# Patient Record
Sex: Male | Born: 1970 | Race: White | Hispanic: No | Marital: Married | Smoking: Former smoker
Health system: Southern US, Community
[De-identification: ages and names within clinical notes are randomized; demographics above are authoritative.]

## PROBLEM LIST (undated history)

## (undated) DIAGNOSIS — C760 Malignant neoplasm of head, face and neck: Secondary | ICD-10-CM

## (undated) DIAGNOSIS — E119 Type 2 diabetes mellitus without complications: Secondary | ICD-10-CM

## (undated) DIAGNOSIS — T7840XA Allergy, unspecified, initial encounter: Secondary | ICD-10-CM

## (undated) HISTORY — DX: Type 2 diabetes mellitus without complications: E11.9

## (undated) HISTORY — DX: Allergy, unspecified, initial encounter: T78.40XA

---

## 2002-03-03 HISTORY — PX: CHOLECYSTECTOMY: SHX55

## 2013-10-21 ENCOUNTER — Ambulatory Visit: Payer: Self-pay | Admitting: Family Medicine

## 2015-01-23 DIAGNOSIS — R0683 Snoring: Secondary | ICD-10-CM | POA: Insufficient documentation

## 2015-01-23 DIAGNOSIS — F200 Paranoid schizophrenia: Secondary | ICD-10-CM | POA: Insufficient documentation

## 2015-01-23 DIAGNOSIS — R0781 Pleurodynia: Secondary | ICD-10-CM | POA: Insufficient documentation

## 2015-01-23 DIAGNOSIS — R0789 Other chest pain: Secondary | ICD-10-CM | POA: Insufficient documentation

## 2015-09-06 DIAGNOSIS — Z6839 Body mass index (BMI) 39.0-39.9, adult: Secondary | ICD-10-CM | POA: Insufficient documentation

## 2016-01-01 DIAGNOSIS — IMO0002 Reserved for concepts with insufficient information to code with codable children: Secondary | ICD-10-CM | POA: Insufficient documentation

## 2018-05-10 ENCOUNTER — Ambulatory Visit: Admission: EM | Admit: 2018-05-10 | Discharge: 2018-05-10 | Discharge status: Absent without leave | Payer: Self-pay

## 2018-07-20 ENCOUNTER — Other Ambulatory Visit: Payer: Self-pay | Admitting: Otolaryngology

## 2018-07-20 DIAGNOSIS — K1370 Unspecified lesions of oral mucosa: Secondary | ICD-10-CM

## 2018-07-23 ENCOUNTER — Ambulatory Visit
Admission: RE | Admit: 2018-07-23 | Discharge: 2018-07-23 | Disposition: A | Discharge status: Home / Self Care | Payer: Medicare Other | Source: Ambulatory Visit | Attending: Otolaryngology | Admitting: Otolaryngology

## 2018-07-23 ENCOUNTER — Encounter (INDEPENDENT_AMBULATORY_CARE_PROVIDER_SITE_OTHER): Payer: Self-pay

## 2018-07-23 ENCOUNTER — Other Ambulatory Visit: Payer: Self-pay

## 2018-07-23 DIAGNOSIS — K1370 Unspecified lesions of oral mucosa: Secondary | ICD-10-CM | POA: Diagnosis not present

## 2018-07-30 ENCOUNTER — Other Ambulatory Visit: Payer: Self-pay

## 2018-07-30 DIAGNOSIS — C4442 Squamous cell carcinoma of skin of scalp and neck: Secondary | ICD-10-CM | POA: Insufficient documentation

## 2018-07-30 DIAGNOSIS — C760 Malignant neoplasm of head, face and neck: Secondary | ICD-10-CM | POA: Insufficient documentation

## 2018-07-30 NOTE — Progress Notes (Signed)
[place]  Telephone:(336) [phone] Fax:(336) [phone]  ID: Lucas Escobar OB: 1970/10/13  MR#: [id number]  HGD#:[id number]  Patient Care Team: Marygrace Drought, MD as PCP - General (Family Medicine)  I connected with Lucas Escobar on 08/02/18 at  2:15 PM EDT by video enabled telemedicine visit and verified that I am speaking with the correct person using two identifiers.   I discussed the limitations, risks, security and privacy concerns of performing an evaluation and management service by telemedicine and the availability of in-person appointments. I also discussed with the patient that there may be a patient responsible charge related to this service. The patient expressed understanding and agreed to proceed.   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  He initially presented with pain.  Patient declined in person visit secondary to his paranoid schizophrenia and requested a video enabled telemedicine visit so that his wife could be involved.  Other than his pain he feels well.  He has no neurologic complaints.  He denies any recent fevers or illnesses.  He has a good appetite and denies weight loss.  He denies any chest pain, shortness of breath, cough, or hemoptysis.  He has no nausea, vomiting, constipation, or diarrhea.  He has no urinary complaints.  Patient offers no further specific complaints today.  REVIEW OF SYSTEMS:   Review of Systems  Constitutional: Negative.  Negative for fever, malaise/fatigue and weight loss.  HENT: Negative for sinus pain and sore throat.   Respiratory: Negative.  Negative for cough, hemoptysis and shortness of breath.     Cardiovascular: Negative.  Negative for chest pain and leg swelling.  Gastrointestinal: Negative.  Negative for abdominal pain.  Genitourinary: Negative.  Negative for dysuria.  Musculoskeletal: Negative.  Negative for back pain.  Skin: Negative.  Negative for rash.  Neurological: Negative.  Negative for dizziness, speech change, focal weakness, weakness and headaches.  Psychiatric/Behavioral: Negative.  The patient is not nervous/anxious.     As per HPI. Otherwise, a complete review of systems is negative.  PAST MEDICAL HISTORY: Paranoid schizophrenia. No past medical history on file.  PAST SURGICAL HISTORY: Cholecystectomy.  FAMILY HISTORY: No family history on file.  ADVANCED DIRECTIVES (Y/N):  N  HEALTH MAINTENANCE: Social History   Tobacco Use   Smoking status: Former Smoker    Years: 25.00   Smokeless tobacco: Current User    Types: Snuff, Chew  Substance Use Topics   Alcohol use: Not on file   Drug use: Not on file     Colonoscopy:  PAP:  Bone density:  Lipid panel:  Allergies  Allergen Reactions   Iodine    Latex    Shellfish Allergy     Current Outpatient Medications  Medication Sig Dispense Refill   ARIPiprazole (ABILIFY) 20 MG tablet Take by mouth.     HYDROcodone-acetaminophen (NORCO/VICODIN) 5-325 MG tablet Take 1 tablet by mouth every 6 (six) hours as needed for moderate pain. 90 tablet 0   metFORMIN (GLUCOPHAGE) 500 MG tablet Take by mouth.     traZODone (DESYREL) 100 MG tablet Please take 1 tablet (100mg ) by mouth nightly for sleep.  You may take another 1/2 tablet (50mg ) by mouth nightly as needed for sleep.  No current facility-administered medications for this visit.     OBJECTIVE: There were no vitals filed for this visit.   There is no height or weight on file to calculate BMI.    ECOG FS:0 - Asymptomatic  General: Well-developed, well-nourished, no acute distress. HEENT: Normocephalic, no obvious enlarged lymph nodes  visualized. Neuro: Alert, answering all questions appropriately. Cranial nerves grossly intact. Skin: No rashes or petechiae noted. Psych: Normal affect.  LAB RESULTS:  No results found for: NA, K, CL, CO2, GLUCOSE, BUN, CREATININE, CALCIUM, PROT, ALBUMIN, AST, ALT, ALKPHOS, BILITOT, GFRNONAA, GFRAA  No results found for: WBC, NEUTROABS, HGB, HCT, MCV, PLT   STUDIES: Ct Soft Tissue Neck Wo Contrast  Result Date: 07/23/2018 CLINICAL DATA:  Oral mucosal lesion and enlarged left-sided lymph node. EXAM: CT NECK WITHOUT CONTRAST TECHNIQUE: Multidetector CT imaging of the neck was performed following the standard protocol without intravenous contrast. COMPARISON:  None. FINDINGS: Pharynx and larynx: Assessment for mucosal lesions is limited by the absence of IV contrast, however there is asymmetric masslike left buccal space soft tissue measuring approximately 2 x 2 cm (series 3, image 31). Patent airway. Unremarkable larynx. Salivary glands: No inflammation, mass, or stone. Thyroid: Unremarkable. Lymph nodes: 3.0 x 2.2 cm left level IB lymph node with internal low density anteriorly. Mildly enlarged lymph node slightly more anteriorly in left level IB measuring 12 mm in short axis with a prominent fatty hilum, not clearly pathologic. Mildly prominent number of subcentimeter short axis lymph nodes in the neck bilaterally which are largely symmetric between left and right sides. Vascular: Limited assessment in the absence of IV contrast. Limited intracranial: Unremarkable. Visualized orbits: Unremarkable. Mastoids and visualized paranasal sinuses: Mild mucosal thickening in the bilateral ethmoid, left sphenoid, and bilateral maxillary sinuses. Clear mastoid air cells. Skeleton: Cervicothoracic spondylosis. No suspicious osseous lesion. Upper chest: Clear lung apices. Other: None. IMPRESSION: 2 cm left buccal space mass with 3 cm left level IB nodal metastasis. Electronically Signed   By: Logan Bores M.D.    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  PLAN:    1.  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  Case also discussed with ENT.  CT scan results from Jul 23, 2018 reviewed independently and reported as above with 2 cm left buccal space mass along with a 3 cm left nodal metastasis.  Patient will benefit from concurrent chemotherapy using weekly cisplatin 40 mg/m along with daily XRT.  Patient lives in [place] therefore we discussed the possibility of receiving chemotherapy treatments there with Dr. Mike Gip, but given his underlying paranoid schizophrenia he wishes to have his XRT and chemotherapy in 1 place.  Will get a PET scan later this week to complete the staging work-up.  Patient will then return to clinic in 1 week for an in person visit to discuss the results of his PET scan, additional treatment planning, and consultation with radiation oncology. 2.  Pain: Patient was given a refill of his hydrocodone today.    I provided 60 minutes of face-to-face video visit time during this encounter, and > 50% was spent counseling as documented under my assessment & plan.   Patient expressed understanding and was in agreement with this plan. He also understands that He can call clinic at any time with any questions, concerns, or complaints.   Cancer Staging Squamous cell carcinoma of head and neck ([place]) Staging form: Oral Cavity,  AJCC 8th Edition - Clinical: Stage IVA (cT2, cN2, cM0) - Signed by Lloyd Huger, MD on 08/02/2018   Lloyd Huger, MD   08/02/2018 3:40 PM

## 2018-08-02 ENCOUNTER — Inpatient Hospital Stay: Payer: Medicare Other | Attending: Oncology | Admitting: Oncology

## 2018-08-02 ENCOUNTER — Other Ambulatory Visit: Payer: Self-pay

## 2018-08-02 ENCOUNTER — Encounter: Payer: Self-pay | Admitting: Oncology

## 2018-08-02 DIAGNOSIS — C760 Malignant neoplasm of head, face and neck: Secondary | ICD-10-CM | POA: Diagnosis not present

## 2018-08-02 DIAGNOSIS — Z5111 Encounter for antineoplastic chemotherapy: Secondary | ICD-10-CM | POA: Insufficient documentation

## 2018-08-02 DIAGNOSIS — Z7189 Other specified counseling: Secondary | ICD-10-CM

## 2018-08-02 DIAGNOSIS — F200 Paranoid schizophrenia: Secondary | ICD-10-CM | POA: Insufficient documentation

## 2018-08-02 DIAGNOSIS — M542 Cervicalgia: Secondary | ICD-10-CM | POA: Insufficient documentation

## 2018-08-02 DIAGNOSIS — Z79899 Other long term (current) drug therapy: Secondary | ICD-10-CM | POA: Insufficient documentation

## 2018-08-02 DIAGNOSIS — Z87891 Personal history of nicotine dependence: Secondary | ICD-10-CM | POA: Insufficient documentation

## 2018-08-02 MED ORDER — HYDROCODONE-ACETAMINOPHEN 5-325 MG PO TABS: 1.0000 | ORAL_TABLET | Freq: Four times a day (QID) | ORAL | 0 refills | Status: DC | PRN

## 2018-08-02 NOTE — Progress Notes (Signed)
Doximity visit for new evaluation regarding head and neck cancer. Patient is diagnosed with paranoid schizophrenia and per patients wife request visit was changed to doximity visit so she can be present for this initial visit.

## 2018-08-05 ENCOUNTER — Other Ambulatory Visit: Payer: Medicare Other

## 2018-08-06 NOTE — Progress Notes (Signed)
Tumor Board Documentation  Lucas Escobar was presented by Dr Grayland Ormond at our Tumor Board on 08/05/2018, which included representatives from medical oncology, radiation oncology, surgical, radiology, pathology, navigation, internal medicine, research.  Lucas Escobar currently presents as a new patient with history of the following treatments: surgical intervention(s), active survellience.  Additionally, we reviewed previous medical and familial history, history of present illness, and recent lab results along with all available histopathologic and imaging studies. The tumor board considered available treatment options and made the following recommendations: Concurrent chemo-radiation therapy    The following procedures/referrals were also placed: No orders of the defined types were placed in this encounter.   Clinical Trial Status: not discussed   Staging used: Clinical Stage  AJCC Staging: T: Ct2 N: cN2 M: M0 Group: Stage 4 A SCC Head and Neck    National site-specific guidelines NCCN were discussed with respect to the case.  Tumor board is a meeting of clinicians from various specialty areas who evaluate and discuss patients for whom a multidisciplinary approach is being considered. Final determinations in the plan of care are those of the provider(s). The responsibility for follow up of recommendations given during tumor board is that of the provider.   Today's extended care, comprehensive team conference, Lucas Escobar was not present for the discussion and was not examined.   Multidisciplinary Tumor Board is a multidisciplinary case peer review process.  Decisions discussed in the Multidisciplinary Tumor Board reflect the opinions of the specialists present at the conference without having examined the patient.  Ultimately, treatment and diagnostic decisions rest with the primary provider(s) and the patient.

## 2018-08-08 NOTE — Progress Notes (Signed)
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  INTERVAL HISTORY: Patient returns to clinic today for further evaluation, discussion of his imaging results, and treatment planning.  He continues to have mouth and neck pain, but otherwise feels well.  He has no neurologic complaints.  He denies any recent fevers or illnesses.  He has a good appetite and denies weight loss.  He denies any chest pain, shortness of breath, cough, or hemoptysis.  He has no nausea, vomiting, constipation, or diarrhea.  He has no urinary complaints.  Patient offers no further specific complaints today.  REVIEW OF SYSTEMS:   Review of Systems  Constitutional: Negative.  Negative for fever, malaise/fatigue and weight loss.  HENT: Negative for sinus pain and sore throat.   Respiratory: Negative.  Negative for cough, hemoptysis and shortness of breath.   Cardiovascular: Negative.  Negative for chest pain and leg swelling.  Gastrointestinal: Negative.  Negative for abdominal pain.  Genitourinary: Negative.  Negative for dysuria.  Musculoskeletal: Negative.  Negative for back pain.  Skin: Negative.  Negative for rash.  Neurological: Negative.  Negative for dizziness, speech change, focal weakness, weakness and headaches.  Psychiatric/Behavioral: Negative.  The patient is not nervous/anxious.     As per HPI. Otherwise, a complete review of systems is negative.  PAST MEDICAL HISTORY: Paranoid schizophrenia. Past Medical History:  Diagnosis Date  . Head and neck cancer ([place])     PAST SURGICAL HISTORY: Cholecystectomy.  FAMILY HISTORY: History reviewed. No pertinent family history.  ADVANCED DIRECTIVES (Y/N):  N  HEALTH MAINTENANCE: Social History   Tobacco Use   . Smoking status: Former Smoker    Years: 25.00  . Smokeless tobacco: Current User    Types: Snuff, Chew  Substance Use Topics  . Alcohol use: Not on file  . Drug use: Not on file     Colonoscopy:  PAP:  Bone density:  Lipid panel:  Allergies  Allergen Reactions  . Iodine   . Latex   . Shellfish Allergy     Current Outpatient Medications  Medication Sig Dispense Refill  . ARIPiprazole (ABILIFY) 20 MG tablet Take by mouth.    Marland Kitchen HYDROcodone-acetaminophen (NORCO/VICODIN) 5-325 MG tablet Take 1 tablet by mouth every 6 (six) hours as needed for moderate pain. 90 tablet 0  . metFORMIN (GLUCOPHAGE) 500 MG tablet Take by mouth.    . traZODone (DESYREL) 100 MG tablet Please take 1 tablet (100mg ) by mouth nightly for sleep.  You may take another 1/2 tablet (50mg ) by mouth nightly as needed for sleep.     No current facility-administered medications for this visit.     OBJECTIVE: Vitals:   08/11/18 0958  BP: 118/81  Pulse: 64  Temp: (!) 97.4 F (36.3 C)     Body mass index is 44.59 kg/m.    ECOG FS:0 - Asymptomatic  General: Well-developed, well-nourished, no acute distress. Eyes: Pink conjunctiva, anicteric sclera. HEENT: Normocephalic, moist mucous membranes, clear oropharnyx.  Easily palpable lymph node on left neck. Lungs: Clear to auscultation bilaterally. Heart: Regular rate and rhythm. No rubs, murmurs, or gallops. Abdomen: Soft, nontender, nondistended. No organomegaly noted, normoactive bowel sounds. Musculoskeletal: No edema, cyanosis, or clubbing. Neuro: Alert, answering all questions appropriately. Cranial nerves grossly intact. Skin: No rashes or petechiae noted. Psych:  Normal affect.  LAB RESULTS:  No results found for: NA, K, CL, CO2, GLUCOSE, BUN, CREATININE, CALCIUM, PROT, ALBUMIN, AST, ALT, ALKPHOS, BILITOT, GFRNONAA, GFRAA  No results found for: WBC, NEUTROABS, HGB, HCT, MCV, PLT   STUDIES: Ct Soft Tissue Neck Wo Contrast  Result Date: 07/23/2018  CLINICAL DATA:  Oral mucosal lesion and enlarged left-sided lymph node. EXAM: CT NECK WITHOUT CONTRAST TECHNIQUE: Multidetector CT imaging of the neck was performed following the standard protocol without intravenous contrast. COMPARISON:  None. FINDINGS: Pharynx and larynx: Assessment for mucosal lesions is limited by the absence of IV contrast, however there is asymmetric masslike left buccal space soft tissue measuring approximately 2 x 2 cm (series 3, image 31). Patent airway. Unremarkable larynx. Salivary glands: No inflammation, mass, or stone. Thyroid: Unremarkable. Lymph nodes: 3.0 x 2.2 cm left level IB lymph node with internal low density anteriorly. Mildly enlarged lymph node slightly more anteriorly in left level IB measuring 12 mm in short axis with a prominent fatty hilum, not clearly pathologic. Mildly prominent number of subcentimeter short axis lymph nodes in the neck bilaterally which are largely symmetric between left and right sides. Vascular: Limited assessment in the absence of IV contrast. Limited intracranial: Unremarkable. Visualized orbits: Unremarkable. Mastoids and visualized paranasal sinuses: Mild mucosal thickening in the bilateral ethmoid, left sphenoid, and bilateral maxillary sinuses. Clear mastoid air cells. Skeleton: Cervicothoracic spondylosis. No suspicious osseous lesion. Upper chest: Clear lung apices. Other: None. IMPRESSION: 2 cm left buccal space mass with 3 cm left level IB nodal metastasis. Electronically Signed   By: Logan Bores M.D.   On: 07/23/2018 18:09   Nm Pet Image Initial (pi) Skull Base To Thigh  Result Date: 08/09/2018 CLINICAL DATA:  Initial treatment strategy for squamous cell carcinoma of the head neck. EXAM: NUCLEAR MEDICINE PET SKULL BASE TO THIGH TECHNIQUE: 16.27 mCi F-18 FDG was injected intravenously. Full-ring PET imaging was performed from the skull base to thigh after the radiotracer. CT data was obtained and used for attenuation correction and  anatomic localization. Fasting blood glucose: 117 mg/dl COMPARISON:  Neck CT 07/23/2018 FINDINGS: Mediastinal blood pool activity: SUV max 2.95 Liver activity: SUV max 6.15 NECK: The 2 cm left buccal space mass is markedly hypermetabolic with SUV max of 06.23. The 3 cm left sided level 1B partially necrotic lymph node is hypermetabolic with SUV max of 76.28. A few other smaller scattered neck nodes are noted bilaterally but I do not see any hypermetabolic nodes. No supraclavicular adenopathy. Incidental CT findings: none CHEST: No hypermetabolic mediastinal or hilar nodes. No suspicious pulmonary nodules on the CT scan. Incidental CT findings: none ABDOMEN/PELVIS: No abnormal hypermetabolic activity within the liver, pancreas, adrenal glands, or spleen. No hypermetabolic lymph nodes in the abdomen or pelvis. Incidental CT findings: Status post cholecystectomy. No biliary dilatation. SKELETON: No focal hypermetabolic activity to suggest skeletal metastasis. Incidental CT findings: none IMPRESSION: 1. Markedly hypermetabolic 2 cm left buccal space mass consistent with known squamous cell carcinoma. There is also a 3 cm partially necrotic hypermetabolic left-sided level 1B metastatic lymph node. 2. No findings for metastatic disease involving the chest, abdomen, pelvis or bony structures. Electronically Signed   By: Marijo Sanes M.D.   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  PLAN:    1.  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  Case also discussed with ENT.  PET scan results from August 09, 2018 reviewed independently and reported as above confirming stage of disease with no distant metastasis.  Patient had consultation with radiation oncology today and will benefit from concurrent chemotherapy using weekly cisplatin 40 mg/m along with daily XRT.  Port placement was discussed, but not necessary at this  time.  Patient will return to clinic on August 24, 2020 initiate cycle 1 of weekly cisplatin.  2.  Pain: Continue hydrocodone as needed. 3.  Paranoid schizophrenia: Continue current medications.  Monitor.   Patient expressed understanding and was in agreement with this plan. He also understands that He can call clinic at any time with any questions, concerns, or complaints.   Cancer Staging Squamous cell carcinoma of head and neck ([place]) Staging form: Oral Cavity, AJCC 8th Edition - Clinical: Stage IVA (cT2, cN2, cM0) - Signed by Lloyd Huger, MD on 08/02/2018   Lloyd Huger, MD   08/12/2018 6:24 AM

## 2018-08-09 ENCOUNTER — Other Ambulatory Visit: Payer: Self-pay

## 2018-08-09 ENCOUNTER — Ambulatory Visit
Admission: RE | Admit: 2018-08-09 | Discharge: 2018-08-09 | Disposition: A | Discharge status: Home / Self Care | Payer: Medicare Other | Source: Ambulatory Visit | Attending: Oncology | Admitting: Oncology

## 2018-08-09 DIAGNOSIS — E119 Type 2 diabetes mellitus without complications: Secondary | ICD-10-CM | POA: Insufficient documentation

## 2018-08-09 DIAGNOSIS — C760 Malignant neoplasm of head, face and neck: Secondary | ICD-10-CM

## 2018-08-09 LAB — GLUCOSE, CAPILLARY: Glucose-Capillary: 117 mg/dL — ABNORMAL HIGH (ref 70–99)

## 2018-08-09 MED ORDER — FLUDEOXYGLUCOSE F - 18 (FDG) INJECTION
16.0000 | Freq: Once | INTRAVENOUS | Status: AC | PRN
  Administered 2018-08-09: 16.27 via INTRAVENOUS

## 2018-08-10 ENCOUNTER — Other Ambulatory Visit: Payer: Self-pay

## 2018-08-11 ENCOUNTER — Inpatient Hospital Stay (HOSPITAL_BASED_OUTPATIENT_CLINIC_OR_DEPARTMENT_OTHER): Payer: Medicare Other | Admitting: Oncology

## 2018-08-11 ENCOUNTER — Other Ambulatory Visit: Payer: Self-pay

## 2018-08-11 ENCOUNTER — Telehealth: Payer: Self-pay

## 2018-08-11 ENCOUNTER — Ambulatory Visit
Admission: RE | Admit: 2018-08-11 | Discharge: 2018-08-11 | Disposition: A | Discharge status: Home / Self Care | Payer: Medicare Other | Source: Ambulatory Visit | Attending: Radiation Oncology | Admitting: Radiation Oncology

## 2018-08-11 ENCOUNTER — Encounter: Payer: Self-pay | Admitting: Radiation Oncology

## 2018-08-11 ENCOUNTER — Encounter: Payer: Self-pay | Admitting: Oncology

## 2018-08-11 VITALS — BP 118/81 | HR 64 | Temp 97.4°F | Ht 73.0 in | Wt 338.0 lb

## 2018-08-11 VITALS — BP 118/81 | HR 64 | Temp 97.4°F | Resp 16 | Wt 338.4 lb

## 2018-08-11 DIAGNOSIS — Z79899 Other long term (current) drug therapy: Secondary | ICD-10-CM

## 2018-08-11 DIAGNOSIS — F200 Paranoid schizophrenia: Secondary | ICD-10-CM | POA: Insufficient documentation

## 2018-08-11 DIAGNOSIS — M542 Cervicalgia: Secondary | ICD-10-CM

## 2018-08-11 DIAGNOSIS — Z87891 Personal history of nicotine dependence: Secondary | ICD-10-CM | POA: Diagnosis not present

## 2018-08-11 DIAGNOSIS — Z7984 Long term (current) use of oral hypoglycemic drugs: Secondary | ICD-10-CM | POA: Diagnosis not present

## 2018-08-11 DIAGNOSIS — Z5111 Encounter for antineoplastic chemotherapy: Secondary | ICD-10-CM | POA: Diagnosis present

## 2018-08-11 DIAGNOSIS — C060 Malignant neoplasm of cheek mucosa: Secondary | ICD-10-CM | POA: Diagnosis present

## 2018-08-11 DIAGNOSIS — C760 Malignant neoplasm of head, face and neck: Secondary | ICD-10-CM

## 2018-08-11 HISTORY — DX: Malignant neoplasm of head, face and neck: C76.0

## 2018-08-11 NOTE — Consult Note (Signed)
NEW PATIENT EVALUATION  Name: Lucas Escobar  MRN: [id number]  Date:   08/11/2018     DOB: Jan 14, 1971   This 48 y.o. 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  REFERRING PHYSICIAN: Marygrace Drought, MD  CHIEF COMPLAINT:  Chief Complaint  Patient presents with  . Cancer    Initial consultation    DIAGNOSIS: The encounter diagnosis was Squamous cell carcinoma of head and neck ([place]).   PREVIOUS INVESTIGATIONS:  PET/CT and CT scans reviewed Pathology report reviewed Clinical notes reviewed Case presented at weekly tumor conference  HPI: Patient is a 48 year old male who presented over the past several months with increasing left cheek pain.  He does have a diagnosis of paranoid schizophrenia with seems to delay his diagnosis.  He was found to have a left pupil mucosal ulcerated lesion.  He has no evidence of dysphasia or other head and neck pain.  CT scan of the head and neck revealed a 2 cm left bugle space mass with a 3 cm left level 1B node.  PET/CT showed hypermetabolic activity in the left nuchal mass consistent with known malignancy.  There is also a 3 cm partial necrotic hypermetabolic left sided level 1B node.  No other evidence of distal nodal disease or metastatic disease.  Biopsy was positive for poorly differentiated squamous cell carcinoma.  HPV's study was negative.  Patient been seen by medical oncology is now referred to radiation oncology for opinion.  He is having no weight loss no dysphasia at this time.  PLANNED TREATMENT REGIMEN: Concurrent chemoradiation  PAST MEDICAL HISTORY:  has a past medical history of Head and neck cancer ([place]).    PAST SURGICAL HISTORY: History reviewed. No pertinent surgical history.  FAMILY HISTORY: family history is not on file.  SOCIAL HISTORY:  reports that he has quit smoking. He quit after 25.00 years of use. His smokeless tobacco use  includes snuff and chew.  ALLERGIES: Iodine; Latex; and Shellfish allergy  MEDICATIONS:  Current Outpatient Medications  Medication Sig Dispense Refill  . ARIPiprazole (ABILIFY) 20 MG tablet Take by mouth.    Marland Kitchen HYDROcodone-acetaminophen (NORCO/VICODIN) 5-325 MG tablet Take 1 tablet by mouth every 6 (six) hours as needed for moderate pain. 90 tablet 0  . metFORMIN (GLUCOPHAGE) 500 MG tablet Take by mouth.    . traZODone (DESYREL) 100 MG tablet Please take 1 tablet (100mg ) by mouth nightly for sleep.  You may take another 1/2 tablet (50mg ) by mouth nightly as needed for sleep.     No current facility-administered medications for this encounter.     ECOG PERFORMANCE STATUS:  1 - Symptomatic but completely ambulatory  REVIEW OF SYSTEMS: Patient does have history of paranoid schizophrenia Patient denies any weight loss, fatigue, weakness, fever, chills or night sweats. Patient denies any loss of vision, blurred vision. Patient denies any ringing  of the ears or hearing loss. No irregular heartbeat. Patient denies heart murmur or history of fainting. Patient denies any chest pain or pain radiating to her upper extremities. Patient denies any shortness of breath, difficulty breathing at night, cough or hemoptysis. Patient denies any swelling in the lower legs. Patient denies any nausea vomiting, vomiting of blood, or coffee ground material in the vomitus. Patient denies any stomach pain. Patient states has had normal bowel movements no significant constipation or diarrhea. Patient denies any dysuria, hematuria or significant nocturia. Patient denies any problems walking, swelling  in the joints or loss of balance. Patient denies any skin changes, loss of hair or loss of weight. Patient denies any excessive worrying or anxiety or significant depression. Patient denies any problems with insomnia. Patient denies excessive thirst, polyuria, polydipsia. Patient denies any swollen glands, patient denies easy  bruising or easy bleeding. Patient denies any recent infections, allergies or URI. Patient "s visual fields have not changed significantly in recent time.   PHYSICAL EXAM: BP 118/81 (BP Location: Left Arm, Patient Position: Sitting)   Pulse 64   Temp (!) 97.4 F (36.3 C) (Tympanic)   Resp 16   Wt (!) 338 lb 6.5 oz (153.5 kg)   BMI 44.65 kg/m  Patient communicates well with no obvious altered mental status.  Left cheek shows an ulcerated mass approximately 2 cm in size consistent with no malignancy.  No evidence of cervical or supraclavicular adenopathy is identified does have a thick neck making neck exam difficult.  Well-developed well-nourished patient in NAD. HEENT reveals PERLA, EOMI, discs not visualized.  Oral cavity is clear. No oral mucosal lesions are identified. Neck is clear without evidence of cervical or supraclavicular adenopathy. Lungs are clear to A&P. Cardiac examination is essentially unremarkable with regular rate and rhythm without murmur rub or thrill. Abdomen is benign with no organomegaly or masses noted. Motor sensory and DTR levels are equal and symmetric in the upper and lower extremities. Cranial nerves II through XII are grossly intact. Proprioception is intact. No peripheral adenopathy or edema is identified. No motor or sensory levels are noted. Crude visual fields are within normal range.  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  We will plan on delivering 7000 cGy to the left bugle mucosal lesion as well as left hypermetabolic node.  I would treat the remainder of the left neck cervical chain to 5400 cGy using IMRT treatment planning and delivery.  I believe we can spare his right sided neck nodes.   Risks and benefits of treatment occluding oral mucositis fatigue alteration of blood counts possible radiation esophagitis causing dysphasia skin reaction all were discussed in detail with the patient.  His wife was on a conference call with [place] during my examination.  Patient comprehends my treatment plan well.  There will be extra effort by both professional staff as well as technical staff to coordinate and manage concurrent chemoradiation and ensuing side effects during his treatments. I personally set up and ordered CT simulation for later this week.  I would like to take this opportunity to thank you for allowing me to participate in the care of your patient.Noreene Filbert, MD

## 2018-08-11 NOTE — Patient Instructions (Signed)
Cisplatin injection  What is this medicine?  CISPLATIN (SIS pla tin) is a chemotherapy drug. It targets fast dividing cells, like cancer cells, and causes these cells to die. This medicine is used to treat many types of cancer like bladder, ovarian, and testicular cancers.  This medicine may be used for other purposes; ask your health care provider or pharmacist if you have questions.  COMMON BRAND NAME(S): Platinol, Platinol -AQ  What should I tell my health care provider before I take this medicine?  They need to know if you have any of these conditions:  -blood disorders  -hearing problems  -kidney disease  -recent or ongoing radiation therapy  -an unusual or allergic reaction to cisplatin, carboplatin, other chemotherapy, other medicines, foods, dyes, or preservatives  -pregnant or trying to get pregnant  -breast-feeding  How should I use this medicine?  This drug is given as an infusion into a vein. It is administered in a hospital or clinic by a specially trained health care professional.  Talk to your pediatrician regarding the use of this medicine in children. Special care may be needed.  Overdosage: If you think you have taken too much of this medicine contact a poison control center or emergency room at once.  NOTE: This medicine is only for you. Do not share this medicine with others.  What if I miss a dose?  It is important not to miss a dose. Call your doctor or health care professional if you are unable to keep an appointment.  What may interact with this medicine?  -dofetilide  -foscarnet  -medicines for seizures  -medicines to increase blood counts like filgrastim, pegfilgrastim, sargramostim  -probenecid  -pyridoxine used with altretamine  -rituximab  -some antibiotics like amikacin, gentamicin, neomycin, polymyxin B, streptomycin, tobramycin  -sulfinpyrazone  -vaccines  -zalcitabine  Talk to your doctor or health care professional before taking any of these  medicines:  -acetaminophen  -aspirin  -ibuprofen  -ketoprofen  -naproxen  This list may not describe all possible interactions. Give your health care provider a list of all the medicines, herbs, non-prescription drugs, or dietary supplements you use. Also tell them if you smoke, drink alcohol, or use illegal drugs. Some items may interact with your medicine.  What should I watch for while using this medicine?  Your condition will be monitored carefully while you are receiving this medicine. You will need important blood work done while you are taking this medicine.  This drug may make you feel generally unwell. This is not uncommon, as chemotherapy can affect healthy cells as well as cancer cells. Report any side effects. Continue your course of treatment even though you feel ill unless your doctor tells you to stop.  In some cases, you may be given additional medicines to help with side effects. Follow all directions for their use.  Call your doctor or health care professional for advice if you get a fever, chills or sore throat, or other symptoms of a cold or flu. Do not treat yourself. This drug decreases your body's ability to fight infections. Try to avoid being around people who are sick.  This medicine may increase your risk to bruise or bleed. Call your doctor or health care professional if you notice any unusual bleeding.  Be careful brushing and flossing your teeth or using a toothpick because you may get an infection or bleed more easily. If you have any dental work done, tell your dentist you are receiving this medicine.  Avoid taking products   that contain aspirin, acetaminophen, ibuprofen, naproxen, or ketoprofen unless instructed by your doctor. These medicines may hide a fever.  Do not become pregnant while taking this medicine. Women should inform their doctor if they wish to become pregnant or think they might be pregnant. There is a potential for serious side effects to an unborn child. Talk to  your health care professional or pharmacist for more information. Do not breast-feed an infant while taking this medicine.  Drink fluids as directed while you are taking this medicine. This will help protect your kidneys.  Call your doctor or health care professional if you get diarrhea. Do not treat yourself.  What side effects may I notice from receiving this medicine?  Side effects that you should report to your doctor or health care professional as soon as possible:  -allergic reactions like skin rash, itching or hives, swelling of the face, lips, or tongue  -signs of infection - fever or chills, cough, sore throat, pain or difficulty passing urine  -signs of decreased platelets or bleeding - bruising, pinpoint red spots on the skin, black, tarry stools, nosebleeds  -signs of decreased red blood cells - unusually weak or tired, fainting spells, lightheadedness  -breathing problems  -changes in hearing  -gout pain  -low blood counts - This drug may decrease the number of white blood cells, red blood cells and platelets. You may be at increased risk for infections and bleeding.  -nausea and vomiting  -pain, swelling, redness or irritation at the injection site  -pain, tingling, numbness in the hands or feet  -problems with balance, movement  -trouble passing urine or change in the amount of urine  Side effects that usually do not require medical attention (report to your doctor or health care professional if they continue or are bothersome):  -changes in vision  -loss of appetite  -metallic taste in the mouth or changes in taste  This list may not describe all possible side effects. Call your doctor for medical advice about side effects. You may report side effects to FDA at 1-800-FDA-1088.  Where should I keep my medicine?  This drug is given in a hospital or clinic and will not be stored at home.  NOTE: This sheet is a summary. It may not cover all possible information. If you have questions about this medicine,  talk to your doctor, pharmacist, or health care provider.   2019 Elsevier/Gold Standard (2007-05-25 14:40:54)

## 2018-08-11 NOTE — Progress Notes (Signed)
Patient is here today to go over his PET Scan results.

## 2018-08-11 NOTE — Telephone Encounter (Signed)
Telephone call to patient and wife.  Patient in agreement for chemo class to be conducted over Web Ex on June 22nd at 1:00 pm.  Education material sent via e-mail.

## 2018-08-11 NOTE — Consult Note (Signed)
.  gcco

## 2018-08-12 ENCOUNTER — Other Ambulatory Visit: Payer: Self-pay

## 2018-08-12 ENCOUNTER — Ambulatory Visit
Admission: RE | Admit: 2018-08-12 | Discharge: 2018-08-12 | Disposition: A | Discharge status: Home / Self Care | Payer: Medicare Other | Source: Ambulatory Visit | Attending: Radiation Oncology | Admitting: Radiation Oncology

## 2018-08-12 DIAGNOSIS — C060 Malignant neoplasm of cheek mucosa: Secondary | ICD-10-CM | POA: Diagnosis not present

## 2018-08-12 DIAGNOSIS — Z510 Encounter for antineoplastic radiation therapy: Secondary | ICD-10-CM | POA: Insufficient documentation

## 2018-08-12 MED ORDER — PROCHLORPERAZINE MALEATE 10 MG PO TABS: 10.0000 mg | ORAL_TABLET | Freq: Four times a day (QID) | ORAL | 1 refills | Status: DC | PRN

## 2018-08-12 MED ORDER — ONDANSETRON HCL 8 MG PO TABS: 8.0000 mg | ORAL_TABLET | Freq: Two times a day (BID) | ORAL | 2 refills | Status: DC | PRN

## 2018-08-12 NOTE — Progress Notes (Signed)
START ON PATHWAY REGIMEN - Head and Neck     A cycle is every 7 days:     Cisplatin   **Always confirm dose/schedule in your pharmacy ordering system**  Patient Characteristics: Oropharynx, HPV Negative/Unknown, Clinically Staged, Stage IV Disease Classification: Oropharynx HPV Status: Negative (-) Current Disease Status: No Distant Metastases and No Recurrent Disease AJCC T Category: T2 AJCC 8 Stage Grouping: IVA AJCC N Category: cN2 AJCC M Category: M0 Intent of Therapy: Curative Intent, Discussed with Patient

## 2018-08-16 DIAGNOSIS — Z510 Encounter for antineoplastic radiation therapy: Secondary | ICD-10-CM | POA: Diagnosis not present

## 2018-08-21 NOTE — Progress Notes (Signed)
[facility]  Telephone:([phone]) [phone] Fax:(336) [phone]  ID: Lucas Escobar OB: [birth year]  MR#: [id number]  RXV#:[id number]  Patient Care Team: Marygrace Drought, MD as PCP - General (Family Medicine)  CHIEF COMPLAINT: Stage IVa squamous cell carcinoma of head and neck.  INTERVAL HISTORY: Patient returns to clinic today for further evaluation and consideration of cycle 1 of weekly cisplatin.  He continues to have mouth and neck pain, but otherwise feels well.  He has no neurologic complaints.  He denies any recent fevers or illnesses.  He has a good appetite and denies weight loss.  He denies any chest pain, shortness of breath, cough, or hemoptysis.  He has no nausea, vomiting, constipation, or diarrhea.  He has no urinary complaints.  Patient offers no further specific complaints today.  REVIEW OF SYSTEMS:   Review of Systems  Constitutional: Negative.  Negative for fever, malaise/fatigue and weight loss.  HENT: Negative for sinus pain and sore throat.   Respiratory: Negative.  Negative for cough, hemoptysis and shortness of breath.   Cardiovascular: Negative.  Negative for chest pain and leg swelling.  Gastrointestinal: Negative.  Negative for abdominal pain.  Genitourinary: Negative.  Negative for dysuria.  Musculoskeletal: Negative.  Negative for back pain.  Skin: Negative.  Negative for rash.  Neurological: Negative.  Negative for dizziness, speech change, focal weakness, weakness and headaches.  Psychiatric/Behavioral: The patient is nervous/anxious.     As per HPI. Otherwise, a complete review of systems is negative.  PAST MEDICAL HISTORY: Paranoid schizophrenia. Past Medical History:  Diagnosis Date   Head and neck cancer ([place])     PAST SURGICAL HISTORY: Cholecystectomy.  FAMILY HISTORY: History reviewed. No pertinent family history.  ADVANCED DIRECTIVES (Y/N):  N  HEALTH MAINTENANCE: Social History   Tobacco Use   Smoking status:  Former Smoker    Years: 25.00   Smokeless tobacco: Current User    Types: Snuff, Chew  Substance Use Topics   Alcohol use: Not on file   Drug use: Not on file     Colonoscopy:  PAP:  Bone density:  Lipid panel:  Allergies  Allergen Reactions   Iodine    Latex    Shellfish Allergy     Current Outpatient Medications  Medication Sig Dispense Refill   ARIPiprazole (ABILIFY) 20 MG tablet Take by mouth.     HYDROcodone-acetaminophen (NORCO/VICODIN) 5-325 MG tablet Take 1 tablet by mouth every 6 (six) hours as needed for moderate pain. 90 tablet 0   metFORMIN (GLUCOPHAGE) 500 MG tablet Take by mouth.     ondansetron (ZOFRAN) 8 MG tablet Take 1 tablet (8 mg total) by mouth 2 (two) times daily as needed. 60 tablet 2   prochlorperazine (COMPAZINE) 10 MG tablet Take 1 tablet (10 mg total) by mouth every 6 (six) hours as needed (Nausea or vomiting). 30 tablet 1   traZODone (DESYREL) 100 MG tablet Please take 1 tablet (100mg ) by mouth nightly for sleep.  You may take another 1/2 tablet (50mg ) by mouth nightly as needed for sleep.     No current facility-administered medications for this visit.     OBJECTIVE: Vitals:   08/25/18 0840  BP: 133/75  Pulse: 77  Temp: (!) 97.3 F (36.3 C)     Body mass index is 44.7 kg/m.    ECOG FS:0 - Asymptomatic  General: Well-developed, well-nourished, no acute distress. Eyes: Pink conjunctiva, anicteric sclera. HEENT: Normocephalic, moist mucous membranes, clear oropharnyx.  Easily palpable lymph node on left neck.  Lungs: Clear to auscultation bilaterally. Heart: Regular rate and rhythm. No rubs, murmurs, or gallops. Abdomen: Soft, nontender, nondistended. No organomegaly noted, normoactive bowel sounds. Musculoskeletal: No edema, cyanosis, or clubbing. Neuro: Alert, answering all questions appropriately. Cranial nerves grossly intact. Skin: No rashes or petechiae noted. Psych: Normal affect.  LAB RESULTS:  Lab Results    Component Value Date   NA 138 08/25/2018   K 3.9 08/25/2018   CL 101 08/25/2018   CO2 29 08/25/2018   GLUCOSE 138 (H) 08/25/2018   BUN 16 08/25/2018   CREATININE 1.17 08/25/2018   CALCIUM 9.4 08/25/2018   GFRNONAA >60 08/25/2018   GFRAA >60 08/25/2018    Lab Results  Component Value Date   WBC 5.9 08/25/2018   NEUTROABS 3.1 08/25/2018   HGB 14.2 08/25/2018   HCT 42.9 08/25/2018   MCV 89.2 08/25/2018   PLT 212 08/25/2018     STUDIES: Nm Pet Image Initial (pi) Skull Base To Thigh  Result Date: 08/09/2018 CLINICAL DATA:  Initial treatment strategy for squamous cell carcinoma of the head neck. EXAM: NUCLEAR MEDICINE PET SKULL BASE TO THIGH TECHNIQUE: 16.27 mCi F-18 FDG was injected intravenously. Full-ring PET imaging was performed from the skull base to thigh after the radiotracer. CT data was obtained and used for attenuation correction and anatomic localization. Fasting blood glucose: 117 mg/dl COMPARISON:  Neck CT 07/23/2018 FINDINGS: Mediastinal blood pool activity: SUV max 2.95 Liver activity: SUV max 6.15 NECK: The 2 cm left buccal space mass is markedly hypermetabolic with SUV max of 46.27. The 3 cm left sided level 1B partially necrotic lymph node is hypermetabolic with SUV max of 03.50. A few other smaller scattered neck nodes are noted bilaterally but I do not see any hypermetabolic nodes. No supraclavicular adenopathy. Incidental CT findings: none CHEST: No hypermetabolic mediastinal or hilar nodes. No suspicious pulmonary nodules on the CT scan. Incidental CT findings: none ABDOMEN/PELVIS: No abnormal hypermetabolic activity within the liver, pancreas, adrenal glands, or spleen. No hypermetabolic lymph nodes in the abdomen or pelvis. Incidental CT findings: Status post cholecystectomy. No biliary dilatation. SKELETON: No focal hypermetabolic activity to suggest skeletal metastasis. Incidental CT findings: none IMPRESSION: 1. Markedly hypermetabolic 2 cm left buccal space mass  consistent with known squamous cell carcinoma. There is also a 3 cm partially necrotic hypermetabolic left-sided level 1B metastatic lymph node. 2. No findings for metastatic disease involving the chest, abdomen, pelvis or bony structures. Electronically Signed   By: Marijo Sanes M.D.   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  PLAN:    1.  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  Case also discussed with ENT.  PET scan results from August 09, 2018 reviewed independently and reported as above confirming stage of disease with no distant metastasis.  Port was previously discussed, but is not necessary at this time.  Patient will initiate XRT tomorrow.  Proceed with cycle 1 of weekly cisplatin.  Return to clinic in 1 week for further evaluation and consideration of cycle 2.   2.  Pain: Continue hydrocodone as needed. 3.  Paranoid schizophrenia: Continue current medications.  Monitor.  I spent a total of 30 minutes face-to-face with the patient of which greater than 50% of the visit was spent in counseling and coordination of care as detailed above.  Patient expressed understanding and was in agreement with this plan. He also understands that He can  call clinic at any time with any questions, concerns, or complaints.   Cancer Staging Squamous cell carcinoma of head and neck ([place]) Staging form: Oral Cavity, AJCC 8th Edition - Clinical: Stage IVA (cT2, cN2, cM0) - Signed by Lloyd Huger, MD on 08/02/2018   Lloyd Huger, MD   08/26/2018 6:42 AM

## 2018-08-23 ENCOUNTER — Inpatient Hospital Stay: Payer: Medicare Other

## 2018-08-23 ENCOUNTER — Ambulatory Visit
Admission: RE | Admit: 2018-08-23 | Discharge: 2018-08-23 | Disposition: A | Discharge status: Home / Self Care | Payer: Medicare Other | Source: Ambulatory Visit | Attending: Radiation Oncology | Admitting: Radiation Oncology

## 2018-08-23 ENCOUNTER — Other Ambulatory Visit: Payer: Self-pay

## 2018-08-23 ENCOUNTER — Encounter: Payer: Self-pay | Admitting: Oncology

## 2018-08-24 ENCOUNTER — Ambulatory Visit
Admission: RE | Admit: 2018-08-24 | Discharge: 2018-08-24 | Disposition: A | Discharge status: Home / Self Care | Payer: Medicare Other | Source: Ambulatory Visit | Attending: Radiation Oncology | Admitting: Radiation Oncology

## 2018-08-24 ENCOUNTER — Other Ambulatory Visit: Payer: Self-pay

## 2018-08-24 DIAGNOSIS — Z510 Encounter for antineoplastic radiation therapy: Secondary | ICD-10-CM | POA: Diagnosis not present

## 2018-08-25 ENCOUNTER — Inpatient Hospital Stay: Payer: Medicare Other

## 2018-08-25 ENCOUNTER — Encounter: Payer: Self-pay | Admitting: Oncology

## 2018-08-25 ENCOUNTER — Ambulatory Visit
Admission: RE | Admit: 2018-08-25 | Discharge: 2018-08-25 | Disposition: A | Discharge status: Home / Self Care | Payer: Medicare Other | Source: Ambulatory Visit | Attending: Radiation Oncology | Admitting: Radiation Oncology

## 2018-08-25 ENCOUNTER — Inpatient Hospital Stay (HOSPITAL_BASED_OUTPATIENT_CLINIC_OR_DEPARTMENT_OTHER): Payer: Medicare Other | Admitting: Oncology

## 2018-08-25 ENCOUNTER — Other Ambulatory Visit: Payer: Self-pay

## 2018-08-25 VITALS — BP 120/79 | HR 76

## 2018-08-25 VITALS — BP 133/75 | HR 77 | Temp 97.3°F | Ht 73.0 in | Wt 338.8 lb

## 2018-08-25 DIAGNOSIS — Z510 Encounter for antineoplastic radiation therapy: Secondary | ICD-10-CM | POA: Diagnosis not present

## 2018-08-25 DIAGNOSIS — C760 Malignant neoplasm of head, face and neck: Secondary | ICD-10-CM

## 2018-08-25 DIAGNOSIS — F200 Paranoid schizophrenia: Secondary | ICD-10-CM

## 2018-08-25 DIAGNOSIS — Z79899 Other long term (current) drug therapy: Secondary | ICD-10-CM

## 2018-08-25 DIAGNOSIS — Z87891 Personal history of nicotine dependence: Secondary | ICD-10-CM | POA: Diagnosis not present

## 2018-08-25 DIAGNOSIS — Z5111 Encounter for antineoplastic chemotherapy: Secondary | ICD-10-CM | POA: Diagnosis not present

## 2018-08-25 LAB — BASIC METABOLIC PANEL
Anion gap: 8 (ref 5–15)
BUN: 16 mg/dL (ref 6–20)
CO2: 29 mmol/L (ref 22–32)
Calcium: 9.4 mg/dL (ref 8.9–10.3)
Chloride: 101 mmol/L (ref 98–111)
Creatinine, Ser: 1.17 mg/dL (ref 0.61–1.24)
GFR calc Af Amer: >60 mL/min (ref >60)
GFR calc non Af Amer: >60 mL/min (ref >60)
Glucose, Bld: 138 mg/dL — ABNORMAL HIGH (ref 70–99)
Potassium: 3.9 mmol/L (ref 3.5–5.1)
Sodium: 138 mmol/L (ref 135–145)

## 2018-08-25 LAB — CBC WITH DIFFERENTIAL/PLATELET
Abs Immature Granulocytes: 0.01 10*3/uL (ref 0.00–0.07)
Basophils Absolute: 0 10*3/uL (ref 0.0–0.1)
Basophils Relative: 1 %
Eosinophils Absolute: 0.2 10*3/uL (ref 0.0–0.5)
Eosinophils Relative: 3 %
HCT: 42.9 % (ref 39.0–52.0)
Hemoglobin: 14.2 g/dL (ref 13.0–17.0)
Immature Granulocytes: 0 %
Lymphocytes Relative: 37 %
Lymphs Abs: 2.2 10*3/uL (ref 0.7–4.0)
MCH: 29.5 pg (ref 26.0–34.0)
MCHC: 33.1 g/dL (ref 30.0–36.0)
MCV: 89.2 fL (ref 80.0–100.0)
Monocytes Absolute: 0.4 10*3/uL (ref 0.1–1.0)
Monocytes Relative: 7 %
Neutro Abs: 3.1 10*3/uL (ref 1.7–7.7)
Neutrophils Relative %: 52 %
Platelets: 212 10*3/uL (ref 150–400)
RBC: 4.81 MIL/uL (ref 4.22–5.81)
RDW: 13.5 % (ref 11.5–15.5)
WBC: 5.9 10*3/uL (ref 4.0–10.5)
nRBC: 0 % (ref 0.0–0.2)

## 2018-08-25 MED ORDER — PALONOSETRON HCL INJECTION 0.25 MG/5ML
0.2500 mg | Freq: Once | INTRAVENOUS | Status: AC
  Administered 2018-08-25: 0.25 mg via INTRAVENOUS
  Filled 2018-08-25: qty 5

## 2018-08-25 MED ORDER — SODIUM CHLORIDE 0.9 % IV SOLN
Freq: Once | INTRAVENOUS | Status: AC
  Administered 2018-08-25: 10:00:00 via INTRAVENOUS
  Filled 2018-08-25: qty 250

## 2018-08-25 MED ORDER — SODIUM CHLORIDE 0.9 % IV SOLN
Freq: Once | INTRAVENOUS | Status: AC
  Administered 2018-08-25: 12:00:00 via INTRAVENOUS
  Filled 2018-08-25: qty 5

## 2018-08-25 MED ORDER — POTASSIUM CHLORIDE 2 MEQ/ML IV SOLN
Freq: Once | INTRAVENOUS | Status: AC
  Administered 2018-08-25: 10:00:00 via INTRAVENOUS
  Filled 2018-08-25: qty 1000

## 2018-08-25 MED ORDER — SODIUM CHLORIDE 0.9 % IV SOLN
40.0000 mg/m2 | Freq: Once | INTRAVENOUS | Status: AC
  Administered 2018-08-25: 112 mg via INTRAVENOUS
  Filled 2018-08-25: qty 100

## 2018-08-25 NOTE — Progress Notes (Signed)
Pt tolerated Cisplatin treatment well with no complications or signs of reactions. VSS upon discharge and all pt questions were answered at this time.   Lucas Escobar [employer]

## 2018-08-25 NOTE — Progress Notes (Signed)
Patient is here today to start his new chemo treatment.

## 2018-08-26 ENCOUNTER — Other Ambulatory Visit: Payer: Self-pay

## 2018-08-26 ENCOUNTER — Ambulatory Visit
Admission: RE | Admit: 2018-08-26 | Discharge: 2018-08-26 | Disposition: A | Discharge status: Home / Self Care | Payer: Medicare Other | Source: Ambulatory Visit | Attending: Radiation Oncology | Admitting: Radiation Oncology

## 2018-08-26 DIAGNOSIS — Z510 Encounter for antineoplastic radiation therapy: Secondary | ICD-10-CM | POA: Diagnosis not present

## 2018-08-27 ENCOUNTER — Ambulatory Visit
Admission: RE | Admit: 2018-08-27 | Discharge: 2018-08-27 | Disposition: A | Discharge status: Home / Self Care | Payer: Medicare Other | Source: Ambulatory Visit | Attending: Radiation Oncology | Admitting: Radiation Oncology

## 2018-08-27 ENCOUNTER — Other Ambulatory Visit: Payer: Self-pay

## 2018-08-27 DIAGNOSIS — Z510 Encounter for antineoplastic radiation therapy: Secondary | ICD-10-CM | POA: Diagnosis not present

## 2018-08-29 NOTE — Progress Notes (Signed)
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  INTERVAL HISTORY: Patient returns to clinic today for further evaluation and consideration of cycle 2 of weekly cisplatin.  He tolerated his first infusion well without significant side effects.  He has mild dysphasia, but overall his pain has significantly improved. He has no neurologic complaints.  He denies any recent fevers or illnesses.  He has a good appetite and denies weight loss.  He denies any chest pain, shortness of breath, cough, or hemoptysis.  He has no nausea, vomiting, constipation, or diarrhea.  He has no urinary complaints.  Patient offers no further specific complaints today.  REVIEW OF SYSTEMS:   Review of Systems  Constitutional: Negative.  Negative for fever, malaise/fatigue and weight loss.  HENT: Positive for sore throat. Negative for sinus pain.   Respiratory: Negative.  Negative for cough, hemoptysis and shortness of breath.   Cardiovascular: Negative.  Negative for chest pain and leg swelling.  Gastrointestinal: Negative.  Negative for abdominal pain.  Genitourinary: Negative.  Negative for dysuria.  Musculoskeletal: Negative.  Negative for back pain.  Skin: Negative.  Negative for rash.  Neurological: Negative.  Negative for dizziness, speech change, focal weakness, weakness and headaches.  Psychiatric/Behavioral: The patient is nervous/anxious.     As per HPI. Otherwise, a complete review of systems is negative.  PAST MEDICAL HISTORY: Paranoid schizophrenia. Past Medical History:  Diagnosis Date   Head and neck cancer ([place])     PAST SURGICAL HISTORY: Cholecystectomy.  FAMILY HISTORY: History reviewed. No pertinent family history.  ADVANCED DIRECTIVES  (Y/N):  N  HEALTH MAINTENANCE: Social History   Tobacco Use   Smoking status: Former Smoker    Years: 25.00   Smokeless tobacco: Current User    Types: Snuff, Chew  Substance Use Topics   Alcohol use: Not on file   Drug use: Not on file     Colonoscopy:  PAP:  Bone density:  Lipid panel:  Allergies  Allergen Reactions   Iodine    Latex    Shellfish Allergy     Current Outpatient Medications  Medication Sig Dispense Refill   ARIPiprazole (ABILIFY) 20 MG tablet Take by mouth.     HYDROcodone-acetaminophen (NORCO/VICODIN) 5-325 MG tablet Take 1 tablet by mouth every 6 (six) hours as needed for moderate pain. 90 tablet 0   metFORMIN (GLUCOPHAGE) 500 MG tablet Take by mouth.     ondansetron (ZOFRAN) 8 MG tablet Take 1 tablet (8 mg total) by mouth 2 (two) times daily as needed. 60 tablet 2   prochlorperazine (COMPAZINE) 10 MG tablet Take 1 tablet (10 mg total) by mouth every 6 (six) hours as needed (Nausea or vomiting). 30 tablet 1   sucralfate (CARAFATE) 1 g tablet Take 1 tablet (1 g total) by mouth 3 (three) times daily. Dissolve in 3-4 tbsp warm water, swish and swallow. 90 tablet 3   traZODone (DESYREL) 100 MG tablet Please take 1 tablet (100mg ) by mouth nightly for sleep.  You may take another 1/2 tablet (50mg ) by mouth nightly as needed for sleep.     No current facility-administered medications for this visit.     OBJECTIVE: Vitals:   09/01/18 0849  BP: (!) 160/100  Pulse: 88  Temp: (!) 96.4 F (35.8 C)  Body mass index is 44.7 kg/m.    ECOG FS:0 - Asymptomatic  General: Well-developed, well-nourished, no acute distress. Eyes: Pink conjunctiva, anicteric sclera. HEENT: Normocephalic, moist mucous membranes, clear oropharnyx.  Left cervical lymph node decreased in size. Lungs: Clear to auscultation bilaterally. Heart: Regular rate and rhythm. No rubs, murmurs, or gallops. Abdomen: Soft, nontender, nondistended. No organomegaly noted, normoactive  bowel sounds. Musculoskeletal: No edema, cyanosis, or clubbing. Neuro: Alert, answering all questions appropriately. Cranial nerves grossly intact. Skin: No rashes or petechiae noted. Psych: Normal affect.  LAB RESULTS:  Lab Results  Component Value Date   NA 135 09/01/2018   K 3.7 09/01/2018   CL 100 09/01/2018   CO2 23 09/01/2018   GLUCOSE 221 (H) 09/01/2018   BUN 18 09/01/2018   CREATININE 1.17 09/01/2018   CALCIUM 9.4 09/01/2018   GFRNONAA >60 09/01/2018   GFRAA >60 09/01/2018    Lab Results  Component Value Date   WBC 5.9 09/01/2018   NEUTROABS 3.8 09/01/2018   HGB 14.3 09/01/2018   HCT 42.6 09/01/2018   MCV 87.8 09/01/2018   PLT 225 09/01/2018     STUDIES: Nm Pet Image Initial (pi) Skull Base To Thigh  Result Date: 08/09/2018 CLINICAL DATA:  Initial treatment strategy for squamous cell carcinoma of the head neck. EXAM: NUCLEAR MEDICINE PET SKULL BASE TO THIGH TECHNIQUE: 16.27 mCi F-18 FDG was injected intravenously. Full-ring PET imaging was performed from the skull base to thigh after the radiotracer. CT data was obtained and used for attenuation correction and anatomic localization. Fasting blood glucose: 117 mg/dl COMPARISON:  Neck CT 07/23/2018 FINDINGS: Mediastinal blood pool activity: SUV max 2.95 Liver activity: SUV max 6.15 NECK: The 2 cm left buccal space mass is markedly hypermetabolic with SUV max of 62.69. The 3 cm left sided level 1B partially necrotic lymph node is hypermetabolic with SUV max of 48.54. A few other smaller scattered neck nodes are noted bilaterally but I do not see any hypermetabolic nodes. No supraclavicular adenopathy. Incidental CT findings: none CHEST: No hypermetabolic mediastinal or hilar nodes. No suspicious pulmonary nodules on the CT scan. Incidental CT findings: none ABDOMEN/PELVIS: No abnormal hypermetabolic activity within the liver, pancreas, adrenal glands, or spleen. No hypermetabolic lymph nodes in the abdomen or pelvis.  Incidental CT findings: Status post cholecystectomy. No biliary dilatation. SKELETON: No focal hypermetabolic activity to suggest skeletal metastasis. Incidental CT findings: none IMPRESSION: 1. Markedly hypermetabolic 2 cm left buccal space mass consistent with known squamous cell carcinoma. There is also a 3 cm partially necrotic hypermetabolic left-sided level 1B metastatic lymph node. 2. No findings for metastatic disease involving the chest, abdomen, pelvis or bony structures. Electronically Signed   By: Marijo Sanes M.D.   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  PLAN:    1.  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  Case also discussed with ENT.  PET scan results from August 09, 2018 reviewed independently and reported as above confirming stage of disease with no distant metastasis.  Port was previously discussed, but is not necessary at this time.  Continue daily XRT.  Proceed with cycle 2 of weekly cisplatin today.  Return to clinic in 1 week for further evaluation and consideration of cycle 3. 2.  Pain: Improved.  Continue hydrocodone as needed. 3.  Paranoid schizophrenia: Continue current medications.  Monitor. 4.  Hyperglycemia: Continue metformin.  I spent a total  of 30 minutes face-to-face with the patient of which greater than 50% of the visit was spent in counseling and coordination of care as detailed above.   Patient expressed understanding and was in agreement with this plan. He also understands that He can call clinic at any time with any questions, concerns, or complaints.   Cancer Staging Squamous cell carcinoma of head and neck ([place]) Staging form: Oral Cavity, AJCC 8th Edition - Clinical: Stage IVA (cT2, cN2, cM0) - Signed by Lloyd Huger, MD on 08/02/2018   Lloyd Huger, MD   09/01/2018 9:12 PM

## 2018-08-30 ENCOUNTER — Other Ambulatory Visit: Payer: Self-pay

## 2018-08-30 ENCOUNTER — Ambulatory Visit
Admission: RE | Admit: 2018-08-30 | Discharge: 2018-08-30 | Disposition: A | Discharge status: Home / Self Care | Payer: Medicare Other | Source: Ambulatory Visit | Attending: Radiation Oncology | Admitting: Radiation Oncology

## 2018-08-30 DIAGNOSIS — Z510 Encounter for antineoplastic radiation therapy: Secondary | ICD-10-CM | POA: Diagnosis not present

## 2018-08-31 ENCOUNTER — Ambulatory Visit
Admission: RE | Admit: 2018-08-31 | Discharge: 2018-08-31 | Disposition: A | Discharge status: Home / Self Care | Payer: Medicare Other | Source: Ambulatory Visit | Attending: Radiation Oncology | Admitting: Radiation Oncology

## 2018-08-31 ENCOUNTER — Other Ambulatory Visit: Payer: Self-pay

## 2018-08-31 ENCOUNTER — Other Ambulatory Visit: Payer: Self-pay | Admitting: *Deleted

## 2018-08-31 DIAGNOSIS — Z510 Encounter for antineoplastic radiation therapy: Secondary | ICD-10-CM | POA: Diagnosis not present

## 2018-08-31 MED ORDER — SUCRALFATE 1 G PO TABS: 1.0000 g | ORAL_TABLET | Freq: Three times a day (TID) | ORAL | 3 refills | Status: DC

## 2018-09-01 ENCOUNTER — Ambulatory Visit
Admission: RE | Admit: 2018-09-01 | Discharge: 2018-09-01 | Disposition: A | Discharge status: Home / Self Care | Payer: Medicare Other | Source: Ambulatory Visit | Attending: Radiation Oncology | Admitting: Radiation Oncology

## 2018-09-01 ENCOUNTER — Inpatient Hospital Stay: Payer: Medicare Other | Attending: Oncology

## 2018-09-01 ENCOUNTER — Inpatient Hospital Stay (HOSPITAL_BASED_OUTPATIENT_CLINIC_OR_DEPARTMENT_OTHER): Payer: Medicare Other | Admitting: Oncology

## 2018-09-01 ENCOUNTER — Encounter: Payer: Self-pay | Admitting: Oncology

## 2018-09-01 ENCOUNTER — Other Ambulatory Visit: Payer: Self-pay

## 2018-09-01 ENCOUNTER — Inpatient Hospital Stay: Payer: Medicare Other

## 2018-09-01 VITALS — BP 122/79 | HR 86

## 2018-09-01 VITALS — BP 160/100 | HR 88 | Temp 96.4°F | Ht 73.0 in

## 2018-09-01 DIAGNOSIS — C760 Malignant neoplasm of head, face and neck: Secondary | ICD-10-CM | POA: Diagnosis not present

## 2018-09-01 DIAGNOSIS — Z7984 Long term (current) use of oral hypoglycemic drugs: Secondary | ICD-10-CM

## 2018-09-01 DIAGNOSIS — D701 Agranulocytosis secondary to cancer chemotherapy: Secondary | ICD-10-CM | POA: Diagnosis not present

## 2018-09-01 DIAGNOSIS — F200 Paranoid schizophrenia: Secondary | ICD-10-CM

## 2018-09-01 DIAGNOSIS — R739 Hyperglycemia, unspecified: Secondary | ICD-10-CM | POA: Insufficient documentation

## 2018-09-01 DIAGNOSIS — Z87891 Personal history of nicotine dependence: Secondary | ICD-10-CM | POA: Insufficient documentation

## 2018-09-01 DIAGNOSIS — Z5111 Encounter for antineoplastic chemotherapy: Secondary | ICD-10-CM | POA: Insufficient documentation

## 2018-09-01 DIAGNOSIS — Z510 Encounter for antineoplastic radiation therapy: Secondary | ICD-10-CM | POA: Diagnosis not present

## 2018-09-01 DIAGNOSIS — D696 Thrombocytopenia, unspecified: Secondary | ICD-10-CM | POA: Diagnosis not present

## 2018-09-01 DIAGNOSIS — T451X5A Adverse effect of antineoplastic and immunosuppressive drugs, initial encounter: Secondary | ICD-10-CM | POA: Diagnosis not present

## 2018-09-01 DIAGNOSIS — C060 Malignant neoplasm of cheek mucosa: Secondary | ICD-10-CM | POA: Insufficient documentation

## 2018-09-01 DIAGNOSIS — Z79899 Other long term (current) drug therapy: Secondary | ICD-10-CM

## 2018-09-01 LAB — BASIC METABOLIC PANEL
Anion gap: 12 (ref 5–15)
BUN: 18 mg/dL (ref 6–20)
CO2: 23 mmol/L (ref 22–32)
Calcium: 9.4 mg/dL (ref 8.9–10.3)
Chloride: 100 mmol/L (ref 98–111)
Creatinine, Ser: 1.17 mg/dL (ref 0.61–1.24)
GFR calc Af Amer: >60 mL/min (ref >60)
GFR calc non Af Amer: >60 mL/min (ref >60)
Glucose, Bld: 221 mg/dL — ABNORMAL HIGH (ref 70–99)
Potassium: 3.7 mmol/L (ref 3.5–5.1)
Sodium: 135 mmol/L (ref 135–145)

## 2018-09-01 LAB — CBC WITH DIFFERENTIAL/PLATELET
Abs Immature Granulocytes: 0.02 10*3/uL (ref 0.00–0.07)
Basophils Absolute: 0 10*3/uL (ref 0.0–0.1)
Basophils Relative: 1 %
Eosinophils Absolute: 0.1 10*3/uL (ref 0.0–0.5)
Eosinophils Relative: 2 %
HCT: 42.6 % (ref 39.0–52.0)
Hemoglobin: 14.3 g/dL (ref 13.0–17.0)
Immature Granulocytes: 0 %
Lymphocytes Relative: 27 %
Lymphs Abs: 1.6 10*3/uL (ref 0.7–4.0)
MCH: 29.5 pg (ref 26.0–34.0)
MCHC: 33.6 g/dL (ref 30.0–36.0)
MCV: 87.8 fL (ref 80.0–100.0)
Monocytes Absolute: 0.4 10*3/uL (ref 0.1–1.0)
Monocytes Relative: 6 %
Neutro Abs: 3.8 10*3/uL (ref 1.7–7.7)
Neutrophils Relative %: 64 %
Platelets: 225 10*3/uL (ref 150–400)
RBC: 4.85 MIL/uL (ref 4.22–5.81)
RDW: 13.2 % (ref 11.5–15.5)
WBC: 5.9 10*3/uL (ref 4.0–10.5)
nRBC: 0 % (ref 0.0–0.2)

## 2018-09-01 MED ORDER — POTASSIUM CHLORIDE 2 MEQ/ML IV SOLN
Freq: Once | INTRAVENOUS | Status: AC
  Administered 2018-09-01: 10:00:00 via INTRAVENOUS
  Filled 2018-09-01: qty 1000

## 2018-09-01 MED ORDER — SODIUM CHLORIDE 0.9 % IV SOLN
Freq: Once | INTRAVENOUS | Status: AC
  Administered 2018-09-01: 10:00:00 via INTRAVENOUS
  Filled 2018-09-01: qty 250

## 2018-09-01 MED ORDER — SODIUM CHLORIDE 0.9 % IV SOLN
40.0000 mg/m2 | Freq: Once | INTRAVENOUS | Status: AC
  Administered 2018-09-01: 112 mg via INTRAVENOUS
  Filled 2018-09-01: qty 112

## 2018-09-01 MED ORDER — PALONOSETRON HCL INJECTION 0.25 MG/5ML
0.2500 mg | Freq: Once | INTRAVENOUS | Status: AC
  Administered 2018-09-01: 0.25 mg via INTRAVENOUS
  Filled 2018-09-01: qty 5

## 2018-09-01 MED ORDER — SODIUM CHLORIDE 0.9 % IV SOLN
Freq: Once | INTRAVENOUS | Status: AC
  Administered 2018-09-01: 12:00:00 via INTRAVENOUS
  Filled 2018-09-01: qty 5

## 2018-09-01 NOTE — Progress Notes (Signed)
[place]  [phone] [phone]  Patient Care Team: Marygrace Drought, MD as PCP - General (Family Medicine)   Name of the patient: Lucas Escobar  [id number]  [birth year]   Date of visit: 09/01/2018 Diagnosis- Stage IV squamous cell carcinoma of head and neck   Chief complaint/Reason for visit- Initial Meeting for [facility], preparing for starting chemotherapy  Heme/Onc history:  Oncology History  Squamous cell carcinoma of head and neck ([place])  07/30/2018 Initial Diagnosis   Squamous cell carcinoma of head and neck ([place])   08/02/2018 Cancer Staging   Staging form: Oral Cavity, AJCC 8th Edition - Clinical: Stage IVA (cT2, cN2, cM0) - Signed by Lloyd Huger, MD on 08/02/2018   08/25/2018 -  Chemotherapy   The patient had palonosetron (ALOXI) injection 0.25 mg, 0.25 mg, Intravenous,  Once, 5 of 7 cycles Administration: 0.25 mg (08/25/2018), 0.25 mg (09/01/2018), 0.25 mg (09/09/2018), 0.25 mg (09/22/2018), 0.25 mg (10/06/2018) CISplatin (PLATINOL) 112 mg in sodium chloride 0.9 % 500 mL chemo infusion, 40 mg/m2 = 112 mg, Intravenous,  Once, 5 of 7 cycles Administration: 112 mg (08/25/2018), 112 mg (09/01/2018), 112 mg (09/09/2018), 112 mg (09/22/2018), 112 mg (10/06/2018) fosaprepitant (EMEND) 150 mg, dexamethasone (DECADRON) 12 mg in sodium chloride 0.9 % 145 mL IVPB, , Intravenous,  Once, 5 of 7 cycles Administration:  (08/25/2018),  (09/01/2018),  (09/09/2018),  (09/22/2018),  (10/06/2018)  for chemotherapy treatment.      Interval history-48 year old male who presents to chemo care clinic today for initial meeting in preparation for starting chemotherapy. I introduced the chemo care clinic and we discussed that the role of the clinic is to assist those who are at an increased risk of emergency room visits and/or complications during the course of chemotherapy treatment. We discussed that the increased risk takes into  account factors such as age, performance status, and co-morbidities. We also discussed that for some, this might include barriers to care such as not having a primary care provider, lack of insurance/transportation, or not being able to afford medications. We discussed that the goal of the program is to help prevent unplanned ER visits and help reduce complications during chemotherapy. We do this by discussing specific risk factors to each individual and identifying ways that we can help improve these risk factors and reduce barriers to care.   ECOG FS:1 - Symptomatic but completely ambulatory  Review of systems- Review of Systems  Constitutional: Positive for malaise/fatigue. Negative for chills, fever and weight loss.  HENT: Negative for congestion, ear pain and tinnitus.   Eyes: Negative.  Negative for blurred vision and double vision.  Respiratory: Negative.  Negative for cough, sputum production and shortness of breath.   Cardiovascular: Negative.  Negative for chest pain, palpitations and leg swelling.  Gastrointestinal: Negative.  Negative for abdominal pain, constipation, diarrhea, nausea and vomiting.       Dysphasia  Genitourinary: Negative for dysuria, frequency and urgency.  Musculoskeletal: Negative for back pain and falls.  Skin: Negative.  Negative for rash.  Neurological: Negative.  Negative for weakness and headaches.  Endo/Heme/Allergies: Negative.  Does not bruise/bleed easily.  Psychiatric/Behavioral: Negative for depression. The patient is nervous/anxious. The patient does not have insomnia.      Current treatment-beginning cycle 1 cisplatin today with concurrent radiation  Allergies  Allergen Reactions   Iodine Swelling   Shellfish Allergy Swelling   Latex Rash    Past Medical History:  Diagnosis Date  Head and neck cancer [facility])     Past Surgical History:  Procedure Laterality Date   CHOLECYSTECTOMY  2004   PORTA CATH INSERTION N/A 09/20/2018    Procedure: PORTA CATH INSERTION;  Surgeon: Algernon Huxley, MD;  Location: [facility];  Service: Cardiovascular;  Laterality: N/A;    Social History   Socioeconomic History   Marital status: Married    Spouse name: Not on file   Number of children: Not on file   Years of education: Not on file   Highest education level: Not on file  Occupational History   Not on file  Social Needs   Financial resource strain: Not on file   Food insecurity    Worry: Not on file    Inability: Not on file   Transportation needs    Medical: Not on file    Non-medical: Not on file  Tobacco Use   Smoking status: Former Smoker    Years: 25.00   Smokeless tobacco: Former Systems developer    Types: Snuff, Loss adjuster, chartered  Substance and Sexual Activity   Alcohol use: Not Currently   Drug use: Not Currently   Sexual activity: Not on file  Lifestyle   Physical activity    Days per week: Not on file    Minutes per session: Not on file   Stress: Not on file  Relationships   Social connections    Talks on phone: Not on file    Gets together: Not on file    Attends religious service: Not on file    Active member of club or organization: Not on file    Attends meetings of clubs or organizations: Not on file    Relationship status: Not on file   Intimate partner violence    Fear of current or ex partner: Not on file    Emotionally abused: Not on file    Physically abused: Not on file    Forced sexual activity: Not on file  Other Topics Concern   Not on file  Social History Narrative   Not on file    Family History  Problem Relation Age of Onset   Colon cancer Father    Colon cancer Paternal Grandmother      Current Outpatient Medications:    ARIPiprazole (ABILIFY) 20 MG tablet, Take by mouth., Disp: , Rfl:    HYDROcodone-acetaminophen (NORCO/VICODIN) 5-325 MG tablet, Take 1 tablet by mouth every 6 (six) hours as needed for moderate pain., Disp: 90 tablet, Rfl: 0   lidocaine  (XYLOCAINE) 2 % solution, Use as directed 15 mLs in the mouth or throat every 6 (six) hours as needed for mouth pain. Swish and spit, Disp: 600 mL, Rfl: 1   metFORMIN (GLUCOPHAGE) 500 MG tablet, Take by mouth., Disp: , Rfl:    ondansetron (ZOFRAN) 8 MG tablet, Take 1 tablet (8 mg total) by mouth 2 (two) times daily as needed., Disp: 60 tablet, Rfl: 2   prochlorperazine (COMPAZINE) 10 MG tablet, Take 1 tablet (10 mg total) by mouth every 6 (six) hours as needed (Nausea or vomiting)., Disp: 30 tablet, Rfl: 1   sucralfate (CARAFATE) 1 g tablet, Take 1 tablet (1 g total) by mouth 3 (three) times daily. Dissolve in 3-4 tbsp warm water, swish and swallow., Disp: 90 tablet, Rfl: 3   traZODone (DESYREL) 100 MG tablet, Please take 1 tablet (123m) by mouth nightly for sleep.  You may take another 1/2 tablet (544m by mouth nightly as needed for sleep., Disp: ,  Rfl:   Physical exam: There were no vitals filed for this visit. Physical Exam Constitutional:      Appearance: Normal appearance.  HENT:     Head: Normocephalic and atraumatic.  Eyes:     Pupils: Pupils are equal, round, and reactive to light.  Neck:     Musculoskeletal: Normal range of motion.  Cardiovascular:     Rate and Rhythm: Normal rate and regular rhythm.     Heart sounds: Normal heart sounds. No murmur.  Pulmonary:     Effort: Pulmonary effort is normal.     Breath sounds: Normal breath sounds. No wheezing.  Abdominal:     General: Bowel sounds are normal. There is no distension.     Palpations: Abdomen is soft.     Tenderness: There is no abdominal tenderness.  Musculoskeletal: Normal range of motion.  Skin:    General: Skin is warm and dry.     Findings: No rash.  Neurological:     Mental Status: He is alert and oriented to person, place, and time.  Psychiatric:        Judgment: Judgment normal.      CMP Latest Ref Rng & Units 10/06/2018  Glucose 70 - 99 mg/dL 137(H)  BUN 6 - 20 mg/dL 11  Creatinine 0.61 - 1.24  mg/dL 1.02  Sodium 135 - 145 mmol/L 136  Potassium 3.5 - 5.1 mmol/L 3.8  Chloride 98 - 111 mmol/L 102  CO2 22 - 32 mmol/L 25  Calcium 8.9 - 10.3 mg/dL 9.2   CBC Latest Ref Rng & Units 10/06/2018  WBC 4.0 - 10.5 K/uL 2.5(L)  Hemoglobin 13.0 - 17.0 g/dL 11.7(L)  Hematocrit 39.0 - 52.0 % 33.8(L)  Platelets 150 - 400 K/uL 140(L)    No images are attached to the encounter.  No results found.   Assessment and plan- Patient is a 48 y.o. male who presents to [facility] for initial meeting in preparation for starting chemotherapy for the treatment of stage IV a squamous cell carcinoma of head and neck.   1. Cancer-for a squamous cell carcinoma of head and neck.  He is a 48 year old male who presented over the past several months with increasing left cheek pain.  He was diagnosed with paranoid schizophrenia which seem to have delayed his diagnosis.  He was found to have a left pupil mucosal ulcerated lesion.  Initially denied head and neck pain or dysphasia.  CT scan revealed a 2 cm left bugle space mass.  PET showed hypermetabolic activity in the left nuchal mass consistent with known malignancy.  No other evidence of distal node disease or metastatic disease.  Biopsy was positive for poorly differentiated squamous cell carcinoma.  HP the study was negative.  Plan is for concurrent chemoradiation with weekly cisplatin.  2. Chemo Care Clinic/High Risk for ER/Hospitalization during chemotherapy- We discussed the role of the chemo care clinic and identified patient specific risk factors. I discussed that patient was identified as high risk primarily based on: Lack of social support.  He has a PCP and sees him regularly.  He past medical history is consistent with obesity, paranoid schizophrenia and most recently head and neck cancer.  He lives at home with his wife.  He drives himself to and from his appointments.  He is covered by Medicare.  He does not have any children.  3. Social Determinants  of Health- we discussed that social determinants of health may have significant impacts on health and outcomes for  cancer patients.  Today we discussed specific social determinants of performance status, alcohol use, depression, financial needs, food insecurity, housing, interpersonal violence, social connections, stress, tobacco use, and transportation.  After lengthy discussion the following were identified as areas of need: Depression anxiety and paranoid schizophrenia, lack of social support and a fixed income. we discussed self-referral to [facility] for counseling services, psychiatry for medication management, or palliative care/symptom management as well as primary care providers. We discussed that living with cancer can create tremendous financial burden.  We discussed options for assistance. I asked that if assistance is needed in affording medications or paying bills to please let us know so that we can provide assistance. we discussed options for food including social services.  We will also notify [facility] to see if cancer center can provide support.  Patient informed of food pantry at cancer center and was provided with care package today.  Please notify nursing if un-met needs. We discussed referral to social work. Will also discuss with Elease Etienne to see if [facility] can provide support of utility bills, etc. we discussed options for support groups at the cancer center. If interested, please notify nurse navigator to enroll. we discussed options for managing stress including healthy eating, exercise as well as participating in no charge counseling services at the cancer center and support groups.  If these are of interest, patient can notify either myself or primary nursing team.  4. Co-morbidities Complicating Care: Paranoid schizophrenia, anxiety and prediabetes.  5. Palliative Care- based on stage of cancer and/or identified needs today, I will refer patient to palliative care for  goals of care and advanced care planning.  We also discussed the role of the Symptom Management Clinic at [facility] for acute issues and methods of contacting clinic/provider. He denies needing specific assistance at this time and He will be followed by Dr. Gary Fleet RN and CMA.    Visit Diagnosis 1. Squamous cell carcinoma of head and neck (HCC)      Patient expressed understanding and was in agreement with this plan. He also understands that He can call clinic at any time with any questions, concerns, or complaints.   A total of (25) minutes of face-to-face time was spent with this patient with greater than 50% of that time in counseling and care-coordination.  Rulon Abide, NP, AGNP-C [facility] [place] (work cell) [phone] (office)  CC: Dr. Grayland Ormond

## 2018-09-01 NOTE — Progress Notes (Signed)
Patient stated that he has had some nausea but no vomiting. Patient also stated that for the past three days he has had abdominal pain and ringing in his left ear. Patient denied vomiting, fever, chills, constipation or diarrhea.

## 2018-09-02 ENCOUNTER — Ambulatory Visit
Admission: RE | Admit: 2018-09-02 | Discharge: 2018-09-02 | Disposition: A | Discharge status: Home / Self Care | Payer: Medicare Other | Source: Ambulatory Visit | Attending: Radiation Oncology | Admitting: Radiation Oncology

## 2018-09-02 ENCOUNTER — Other Ambulatory Visit: Payer: Self-pay

## 2018-09-02 DIAGNOSIS — Z510 Encounter for antineoplastic radiation therapy: Secondary | ICD-10-CM | POA: Diagnosis not present

## 2018-09-04 NOTE — Progress Notes (Signed)
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  INTERVAL HISTORY: Patient returns to clinic today for further evaluation and consideration of cycle 3 of weekly cisplatin.  He is tolerating his treatments well without significant side effects.  He continues to have dysphasia and some mild neck pain. He has no neurologic complaints.  He denies any recent fevers or illnesses.  He has a good appetite and denies weight loss.  He denies any chest pain, shortness of breath, cough, or hemoptysis.  He has no nausea, vomiting, constipation, or diarrhea.  He has no urinary complaints.  Patient offers no further specific complaints today.  REVIEW OF SYSTEMS:   Review of Systems  Constitutional: Negative.  Negative for fever, malaise/fatigue and weight loss.  HENT: Positive for sore throat. Negative for sinus pain.   Respiratory: Negative.  Negative for cough, hemoptysis and shortness of breath.   Cardiovascular: Negative.  Negative for chest pain and leg swelling.  Gastrointestinal: Negative.  Negative for abdominal pain.  Genitourinary: Negative.  Negative for dysuria.  Musculoskeletal: Positive for neck pain. Negative for back pain.  Skin: Negative.  Negative for rash.  Neurological: Negative.  Negative for dizziness, speech change, focal weakness, weakness and headaches.  Psychiatric/Behavioral: The patient is nervous/anxious.     As per HPI. Otherwise, a complete review of systems is negative.  PAST MEDICAL HISTORY: Paranoid schizophrenia. Past Medical History:  Diagnosis Date  . Head and neck cancer ([place])     PAST SURGICAL HISTORY: Cholecystectomy.  FAMILY HISTORY: History reviewed. No pertinent family history.  ADVANCED DIRECTIVES (Y/N):   N  HEALTH MAINTENANCE: Social History   Tobacco Use  . Smoking status: Former Smoker    Years: 25.00  . Smokeless tobacco: Current User    Types: Snuff, Chew  Substance Use Topics  . Alcohol use: Not on file  . Drug use: Not on file     Colonoscopy:  PAP:  Bone density:  Lipid panel:  Allergies  Allergen Reactions  . Iodine   . Latex   . Shellfish Allergy     Current Outpatient Medications  Medication Sig Dispense Refill  . ARIPiprazole (ABILIFY) 20 MG tablet Take by mouth.    Marland Kitchen HYDROcodone-acetaminophen (NORCO/VICODIN) 5-325 MG tablet Take 1 tablet by mouth every 6 (six) hours as needed for moderate pain. 90 tablet 0  . metFORMIN (GLUCOPHAGE) 500 MG tablet Take by mouth.    . ondansetron (ZOFRAN) 8 MG tablet Take 1 tablet (8 mg total) by mouth 2 (two) times daily as needed. 60 tablet 2  . prochlorperazine (COMPAZINE) 10 MG tablet Take 1 tablet (10 mg total) by mouth every 6 (six) hours as needed (Nausea or vomiting). 30 tablet 1  . sucralfate (CARAFATE) 1 g tablet Take 1 tablet (1 g total) by mouth 3 (three) times daily. Dissolve in 3-4 tbsp warm water, swish and swallow. 90 tablet 3  . traZODone (DESYREL) 100 MG tablet Please take 1 tablet (100mg ) by mouth nightly for sleep.  You may take another 1/2 tablet (50mg ) by mouth nightly as needed for sleep.     No current facility-administered medications for this visit.     OBJECTIVE: Vitals:   09/08/18 0851  BP: 134/82  Pulse: 75  Temp: (!) 95.9 F (35.5 C)  Body mass index is 44.07 kg/m.    ECOG FS:0 - Asymptomatic  General: Well-developed, well-nourished, no acute distress. Eyes: Pink conjunctiva, anicteric sclera. HEENT: Normocephalic, moist mucous membranes, clear oropharnyx.  Palpable left cervical lymph node. Lungs: Clear to auscultation bilaterally. Heart: Regular rate and rhythm. No rubs, murmurs, or gallops. Abdomen: Soft, nontender, nondistended. No organomegaly noted, normoactive bowel sounds.  Musculoskeletal: No edema, cyanosis, or clubbing. Neuro: Alert, answering all questions appropriately. Cranial nerves grossly intact. Skin: No rashes or petechiae noted. Psych: Normal affect.  LAB RESULTS:  Lab Results  Component Value Date   NA 136 09/08/2018   K 3.8 09/08/2018   CL 100 09/08/2018   CO2 26 09/08/2018   GLUCOSE 122 (H) 09/08/2018   BUN 20 09/08/2018   CREATININE 0.99 09/08/2018   CALCIUM 9.4 09/08/2018   GFRNONAA >60 09/08/2018   GFRAA >60 09/08/2018    Lab Results  Component Value Date   WBC 5.8 09/08/2018   NEUTROABS 3.7 09/08/2018   HGB 13.3 09/08/2018   HCT 39.6 09/08/2018   MCV 87.4 09/08/2018   PLT 179 09/08/2018     STUDIES: No results found.  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  PLAN:    1.  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  Case also discussed with ENT.  PET scan results from August 09, 2018 reviewed independently and reported as above confirming stage of disease with no distant metastasis.  Port was previously discussed, but is not necessary at this time.  Continue with daily XRT.  Proceed with cycle 3 of weekly cisplatin today.  Return to clinic in 1 week for further evaluation and consideration of cycle 4.  2.  Pain: Improved.  Continue hydrocodone as needed. 3.  Paranoid schizophrenia: Continue current medications.  Monitor. 4.  Hyperglycemia: Patient has improved glycemic control.  Continue metformin.  I spent a total of 30 minutes face-to-face with the patient of which greater than 50% of the visit was spent in counseling and coordination of care as detailed above.   Patient expressed understanding and was in agreement with this plan. He also understands that He can call clinic at any time with any questions, concerns, or complaints.   Cancer Staging Squamous cell carcinoma of head and neck ([place]) Staging form: Oral Cavity, AJCC 8th Edition -  Clinical: Stage IVA (cT2, cN2, cM0) - Signed by Lloyd Huger, MD on 08/02/2018   Lloyd Huger, MD   09/11/2018 7:00 AM

## 2018-09-06 ENCOUNTER — Other Ambulatory Visit: Payer: Self-pay

## 2018-09-06 ENCOUNTER — Ambulatory Visit
Admission: RE | Admit: 2018-09-06 | Discharge: 2018-09-06 | Disposition: A | Discharge status: Home / Self Care | Payer: Medicare Other | Source: Ambulatory Visit | Attending: Radiation Oncology | Admitting: Radiation Oncology

## 2018-09-06 DIAGNOSIS — Z510 Encounter for antineoplastic radiation therapy: Secondary | ICD-10-CM | POA: Diagnosis not present

## 2018-09-07 ENCOUNTER — Other Ambulatory Visit: Payer: Self-pay | Admitting: Oncology

## 2018-09-07 ENCOUNTER — Encounter: Payer: Self-pay | Admitting: Oncology

## 2018-09-07 ENCOUNTER — Other Ambulatory Visit: Payer: Self-pay | Admitting: *Deleted

## 2018-09-07 ENCOUNTER — Ambulatory Visit
Admission: RE | Admit: 2018-09-07 | Discharge: 2018-09-07 | Disposition: A | Discharge status: Home / Self Care | Payer: Medicare Other | Source: Ambulatory Visit | Attending: Radiation Oncology | Admitting: Radiation Oncology

## 2018-09-07 ENCOUNTER — Other Ambulatory Visit: Payer: Self-pay

## 2018-09-07 DIAGNOSIS — Z510 Encounter for antineoplastic radiation therapy: Secondary | ICD-10-CM | POA: Diagnosis not present

## 2018-09-07 MED ORDER — HYDROCODONE-ACETAMINOPHEN 5-325 MG PO TABS: 1.0000 | ORAL_TABLET | Freq: Four times a day (QID) | ORAL | 0 refills | Status: DC | PRN

## 2018-09-07 NOTE — Telephone Encounter (Signed)
Dr. Finnegan pt ° °

## 2018-09-08 ENCOUNTER — Encounter: Payer: Self-pay | Admitting: Oncology

## 2018-09-08 ENCOUNTER — Ambulatory Visit
Admission: RE | Admit: 2018-09-08 | Discharge: 2018-09-08 | Disposition: A | Discharge status: Home / Self Care | Payer: Medicare Other | Source: Ambulatory Visit | Attending: Radiation Oncology | Admitting: Radiation Oncology

## 2018-09-08 ENCOUNTER — Other Ambulatory Visit: Payer: Self-pay

## 2018-09-08 ENCOUNTER — Inpatient Hospital Stay: Payer: Medicare Other

## 2018-09-08 ENCOUNTER — Inpatient Hospital Stay (HOSPITAL_BASED_OUTPATIENT_CLINIC_OR_DEPARTMENT_OTHER): Payer: Medicare Other | Admitting: Oncology

## 2018-09-08 VITALS — BP 134/82 | HR 75 | Temp 95.9°F | Ht 73.0 in | Wt 334.0 lb

## 2018-09-08 DIAGNOSIS — Z7984 Long term (current) use of oral hypoglycemic drugs: Secondary | ICD-10-CM

## 2018-09-08 DIAGNOSIS — C760 Malignant neoplasm of head, face and neck: Secondary | ICD-10-CM

## 2018-09-08 DIAGNOSIS — F200 Paranoid schizophrenia: Secondary | ICD-10-CM

## 2018-09-08 DIAGNOSIS — Z510 Encounter for antineoplastic radiation therapy: Secondary | ICD-10-CM | POA: Diagnosis not present

## 2018-09-08 DIAGNOSIS — R739 Hyperglycemia, unspecified: Secondary | ICD-10-CM

## 2018-09-08 DIAGNOSIS — Z5111 Encounter for antineoplastic chemotherapy: Secondary | ICD-10-CM | POA: Diagnosis not present

## 2018-09-08 DIAGNOSIS — Z79899 Other long term (current) drug therapy: Secondary | ICD-10-CM

## 2018-09-08 LAB — BASIC METABOLIC PANEL
Anion gap: 10 (ref 5–15)
BUN: 20 mg/dL (ref 6–20)
CO2: 26 mmol/L (ref 22–32)
Calcium: 9.4 mg/dL (ref 8.9–10.3)
Chloride: 100 mmol/L (ref 98–111)
Creatinine, Ser: 0.99 mg/dL (ref 0.61–1.24)
GFR calc Af Amer: >60 mL/min (ref >60)
GFR calc non Af Amer: >60 mL/min (ref >60)
Glucose, Bld: 122 mg/dL — ABNORMAL HIGH (ref 70–99)
Potassium: 3.8 mmol/L (ref 3.5–5.1)
Sodium: 136 mmol/L (ref 135–145)

## 2018-09-08 LAB — CBC WITH DIFFERENTIAL/PLATELET
Abs Immature Granulocytes: 0.01 10*3/uL (ref 0.00–0.07)
Basophils Absolute: 0 10*3/uL (ref 0.0–0.1)
Basophils Relative: 0 %
Eosinophils Absolute: 0.1 10*3/uL (ref 0.0–0.5)
Eosinophils Relative: 2 %
HCT: 39.6 % (ref 39.0–52.0)
Hemoglobin: 13.3 g/dL (ref 13.0–17.0)
Immature Granulocytes: 0 %
Lymphocytes Relative: 26 %
Lymphs Abs: 1.5 10*3/uL (ref 0.7–4.0)
MCH: 29.4 pg (ref 26.0–34.0)
MCHC: 33.6 g/dL (ref 30.0–36.0)
MCV: 87.4 fL (ref 80.0–100.0)
Monocytes Absolute: 0.5 10*3/uL (ref 0.1–1.0)
Monocytes Relative: 8 %
Neutro Abs: 3.7 10*3/uL (ref 1.7–7.7)
Neutrophils Relative %: 64 %
Platelets: 179 10*3/uL (ref 150–400)
RBC: 4.53 MIL/uL (ref 4.22–5.81)
RDW: 13.2 % (ref 11.5–15.5)
WBC: 5.8 10*3/uL (ref 4.0–10.5)
nRBC: 0 % (ref 0.0–0.2)

## 2018-09-08 NOTE — Progress Notes (Signed)
Patient stated that he had been doing well with no complaints. 

## 2018-09-09 ENCOUNTER — Ambulatory Visit
Admission: RE | Admit: 2018-09-09 | Discharge: 2018-09-09 | Disposition: A | Discharge status: Home / Self Care | Payer: Medicare Other | Source: Ambulatory Visit | Attending: Radiation Oncology | Admitting: Radiation Oncology

## 2018-09-09 ENCOUNTER — Other Ambulatory Visit: Payer: Self-pay

## 2018-09-09 ENCOUNTER — Inpatient Hospital Stay: Payer: Medicare Other

## 2018-09-09 VITALS — BP 147/88 | HR 84 | Temp 96.1°F | Resp 20

## 2018-09-09 DIAGNOSIS — Z5111 Encounter for antineoplastic chemotherapy: Secondary | ICD-10-CM | POA: Diagnosis not present

## 2018-09-09 DIAGNOSIS — C760 Malignant neoplasm of head, face and neck: Secondary | ICD-10-CM

## 2018-09-09 DIAGNOSIS — Z510 Encounter for antineoplastic radiation therapy: Secondary | ICD-10-CM | POA: Diagnosis not present

## 2018-09-09 MED ORDER — POTASSIUM CHLORIDE 2 MEQ/ML IV SOLN
Freq: Once | INTRAVENOUS | Status: AC
  Administered 2018-09-09: 09:00:00 via INTRAVENOUS
  Filled 2018-09-09: qty 1000

## 2018-09-09 MED ORDER — PALONOSETRON HCL INJECTION 0.25 MG/5ML
0.2500 mg | Freq: Once | INTRAVENOUS | Status: AC
  Administered 2018-09-09: 12:00:00 0.25 mg via INTRAVENOUS
  Filled 2018-09-09: qty 5

## 2018-09-09 MED ORDER — SODIUM CHLORIDE 0.9 % IV SOLN
40.0000 mg/m2 | Freq: Once | INTRAVENOUS | Status: AC
  Administered 2018-09-09: 12:00:00 112 mg via INTRAVENOUS
  Filled 2018-09-09: qty 112

## 2018-09-09 MED ORDER — SODIUM CHLORIDE 0.9 % IV SOLN
Freq: Once | INTRAVENOUS | Status: AC
  Administered 2018-09-09: 09:00:00 via INTRAVENOUS
  Filled 2018-09-09: qty 250

## 2018-09-09 MED ORDER — SODIUM CHLORIDE 0.9 % IV SOLN
Freq: Once | INTRAVENOUS | Status: AC
  Administered 2018-09-09: 12:00:00 via INTRAVENOUS
  Filled 2018-09-09: qty 5

## 2018-09-10 ENCOUNTER — Ambulatory Visit
Admission: RE | Admit: 2018-09-10 | Discharge: 2018-09-10 | Disposition: A | Discharge status: Home / Self Care | Payer: Medicare Other | Source: Ambulatory Visit | Attending: Radiation Oncology | Admitting: Radiation Oncology

## 2018-09-10 ENCOUNTER — Other Ambulatory Visit: Payer: Self-pay

## 2018-09-10 DIAGNOSIS — Z510 Encounter for antineoplastic radiation therapy: Secondary | ICD-10-CM | POA: Diagnosis not present

## 2018-09-13 ENCOUNTER — Ambulatory Visit
Admission: RE | Admit: 2018-09-13 | Discharge: 2018-09-13 | Disposition: A | Discharge status: Home / Self Care | Payer: Medicare Other | Source: Ambulatory Visit | Attending: Radiation Oncology | Admitting: Radiation Oncology

## 2018-09-13 ENCOUNTER — Other Ambulatory Visit: Payer: Self-pay

## 2018-09-13 DIAGNOSIS — Z510 Encounter for antineoplastic radiation therapy: Secondary | ICD-10-CM | POA: Diagnosis not present

## 2018-09-14 ENCOUNTER — Other Ambulatory Visit: Payer: Self-pay

## 2018-09-14 ENCOUNTER — Ambulatory Visit
Admission: RE | Admit: 2018-09-14 | Discharge: 2018-09-14 | Disposition: A | Discharge status: Home / Self Care | Payer: Medicare Other | Source: Ambulatory Visit | Attending: Radiation Oncology | Admitting: Radiation Oncology

## 2018-09-14 DIAGNOSIS — Z510 Encounter for antineoplastic radiation therapy: Secondary | ICD-10-CM | POA: Diagnosis not present

## 2018-09-15 ENCOUNTER — Other Ambulatory Visit: Payer: Self-pay

## 2018-09-15 ENCOUNTER — Telehealth: Payer: Self-pay

## 2018-09-15 ENCOUNTER — Inpatient Hospital Stay: Payer: Medicare Other

## 2018-09-15 ENCOUNTER — Inpatient Hospital Stay (HOSPITAL_BASED_OUTPATIENT_CLINIC_OR_DEPARTMENT_OTHER): Payer: Medicare Other | Admitting: Oncology

## 2018-09-15 ENCOUNTER — Ambulatory Visit
Admission: RE | Admit: 2018-09-15 | Discharge: 2018-09-15 | Disposition: A | Discharge status: Home / Self Care | Payer: Medicare Other | Source: Ambulatory Visit | Attending: Radiation Oncology | Admitting: Radiation Oncology

## 2018-09-15 ENCOUNTER — Other Ambulatory Visit (INDEPENDENT_AMBULATORY_CARE_PROVIDER_SITE_OTHER): Payer: Self-pay | Admitting: Nurse Practitioner

## 2018-09-15 ENCOUNTER — Telehealth (INDEPENDENT_AMBULATORY_CARE_PROVIDER_SITE_OTHER): Payer: Self-pay

## 2018-09-15 ENCOUNTER — Encounter: Payer: Self-pay | Admitting: Oncology

## 2018-09-15 VITALS — BP 129/82 | HR 76 | Temp 97.3°F | Ht 73.0 in | Wt 337.0 lb

## 2018-09-15 DIAGNOSIS — C760 Malignant neoplasm of head, face and neck: Secondary | ICD-10-CM

## 2018-09-15 DIAGNOSIS — R739 Hyperglycemia, unspecified: Secondary | ICD-10-CM | POA: Diagnosis not present

## 2018-09-15 DIAGNOSIS — Z5111 Encounter for antineoplastic chemotherapy: Secondary | ICD-10-CM | POA: Diagnosis not present

## 2018-09-15 DIAGNOSIS — Z79899 Other long term (current) drug therapy: Secondary | ICD-10-CM

## 2018-09-15 DIAGNOSIS — Z7984 Long term (current) use of oral hypoglycemic drugs: Secondary | ICD-10-CM

## 2018-09-15 DIAGNOSIS — Z510 Encounter for antineoplastic radiation therapy: Secondary | ICD-10-CM | POA: Diagnosis not present

## 2018-09-15 DIAGNOSIS — F200 Paranoid schizophrenia: Secondary | ICD-10-CM | POA: Diagnosis not present

## 2018-09-15 LAB — BASIC METABOLIC PANEL
Anion gap: 7 (ref 5–15)
BUN: 14 mg/dL (ref 6–20)
CO2: 25 mmol/L (ref 22–32)
Calcium: 8.9 mg/dL (ref 8.9–10.3)
Chloride: 104 mmol/L (ref 98–111)
Creatinine, Ser: 0.83 mg/dL (ref 0.61–1.24)
GFR calc Af Amer: >60 mL/min (ref >60)
GFR calc non Af Amer: >60 mL/min (ref >60)
Glucose, Bld: 125 mg/dL — ABNORMAL HIGH (ref 70–99)
Potassium: 4 mmol/L (ref 3.5–5.1)
Sodium: 136 mmol/L (ref 135–145)

## 2018-09-15 LAB — CBC WITH DIFFERENTIAL/PLATELET
Abs Immature Granulocytes: 0.01 10*3/uL (ref 0.00–0.07)
Basophils Absolute: 0 10*3/uL (ref 0.0–0.1)
Basophils Relative: 0 %
Eosinophils Absolute: 0.1 10*3/uL (ref 0.0–0.5)
Eosinophils Relative: 1 %
HCT: 35.6 % — ABNORMAL LOW (ref 39.0–52.0)
Hemoglobin: 11.9 g/dL — ABNORMAL LOW (ref 13.0–17.0)
Immature Granulocytes: 0 %
Lymphocytes Relative: 19 %
Lymphs Abs: 1 10*3/uL (ref 0.7–4.0)
MCH: 29.6 pg (ref 26.0–34.0)
MCHC: 33.4 g/dL (ref 30.0–36.0)
MCV: 88.6 fL (ref 80.0–100.0)
Monocytes Absolute: 0.4 10*3/uL (ref 0.1–1.0)
Monocytes Relative: 8 %
Neutro Abs: 3.8 10*3/uL (ref 1.7–7.7)
Neutrophils Relative %: 72 %
Platelets: 159 10*3/uL (ref 150–400)
RBC: 4.02 MIL/uL — ABNORMAL LOW (ref 4.22–5.81)
RDW: 13.6 % (ref 11.5–15.5)
WBC: 5.4 10*3/uL (ref 4.0–10.5)
nRBC: 0 % (ref 0.0–0.2)

## 2018-09-15 NOTE — Progress Notes (Signed)
11:04 - notified Dr. Grayland Ormond that two nurses attempted IV access on Lucas Escobar without success, two attempts per each RN.  Per Dr. Grayland Ormond he will setup patient for port placement, upon agreement with patient.  Patient understood and is in agreement with plan.

## 2018-09-15 NOTE — Telephone Encounter (Signed)
Patient's wife-Crystal was called to inform her why her husband was needing a port-a-cath insertion. Crystal stated that she had received a call from Dr. Bunnie Domino office and was told when she could get her COVID-19 testing tomorrow and on Monday he will have the procedure. Crystal had no further questions.

## 2018-09-15 NOTE — Telephone Encounter (Signed)
I spoke with the patient's wife and he is scheduled with Dr. Lucky Cowboy for 09/20/2018 with a 11:15 am arrival time. He will do Covid testing on 09/16/2018 between 12:30-2:30 pm at [facility].

## 2018-09-15 NOTE — Progress Notes (Signed)
Patient stated that he had been doing well. Patient did stated that he has had minor nausea but no vomiting.

## 2018-09-16 ENCOUNTER — Ambulatory Visit
Admission: RE | Admit: 2018-09-16 | Discharge: 2018-09-16 | Disposition: A | Discharge status: Home / Self Care | Payer: Medicare Other | Source: Ambulatory Visit | Attending: Radiation Oncology | Admitting: Radiation Oncology

## 2018-09-16 ENCOUNTER — Other Ambulatory Visit
Admission: RE | Admit: 2018-09-16 | Discharge: 2018-09-16 | Disposition: A | Discharge status: Home / Self Care | Payer: Medicare Other | Source: Ambulatory Visit | Attending: Vascular Surgery | Admitting: Vascular Surgery

## 2018-09-16 ENCOUNTER — Other Ambulatory Visit: Payer: Self-pay

## 2018-09-16 DIAGNOSIS — Z1159 Encounter for screening for other viral diseases: Secondary | ICD-10-CM | POA: Insufficient documentation

## 2018-09-16 DIAGNOSIS — Z510 Encounter for antineoplastic radiation therapy: Secondary | ICD-10-CM | POA: Diagnosis not present

## 2018-09-16 NOTE — Telephone Encounter (Signed)
This patient was scheduled for a port a cath placement due to a chat message sent to me from the cancer center. Maritza Boniche CMA.  Hi Mickel Baas! This patient was to get chemo today but the nurses were not able to do an IV on him. Patient is scheduled to have chemo next Wednesday.He needs a port-a-cath insertion before next Wednesday.

## 2018-09-16 NOTE — Progress Notes (Signed)
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  INTERVAL HISTORY: Patient returns to clinic today for further evaluation and consideration of cycle 4 of weekly cisplatin.  He continues to tolerate his treatments well without significant side effects.  He has some mild dysphasia and neck pain. He has no neurologic complaints.  He denies any recent fevers or illnesses.  He has a good appetite and denies weight loss.  He denies any chest pain, shortness of breath, cough, or hemoptysis.  He has no nausea, vomiting, constipation, or diarrhea.  He has no urinary complaints.  Patient offers no further specific complaints today.  REVIEW OF SYSTEMS:   Review of Systems  Constitutional: Negative.  Negative for fever, malaise/fatigue and weight loss.  HENT: Positive for sore throat. Negative for sinus pain.   Respiratory: Negative.  Negative for cough, hemoptysis and shortness of breath.   Cardiovascular: Negative.  Negative for chest pain and leg swelling.  Gastrointestinal: Negative.  Negative for abdominal pain.  Genitourinary: Negative.  Negative for dysuria.  Musculoskeletal: Positive for neck pain. Negative for back pain.  Skin: Negative.  Negative for rash.  Neurological: Negative.  Negative for dizziness, speech change, focal weakness, weakness and headaches.  Psychiatric/Behavioral: The patient is nervous/anxious.     As per HPI. Otherwise, a complete review of systems is negative.  PAST MEDICAL HISTORY: Paranoid schizophrenia. Past Medical History:  Diagnosis Date  . Head and neck cancer ([place])     PAST SURGICAL HISTORY: Cholecystectomy.  FAMILY HISTORY: History reviewed. No pertinent family history.  ADVANCED DIRECTIVES (Y/N):  N   HEALTH MAINTENANCE: Social History   Tobacco Use  . Smoking status: Former Smoker    Years: 25.00  . Smokeless tobacco: Current User    Types: Snuff, Chew  Substance Use Topics  . Alcohol use: Not on file  . Drug use: Not on file     Colonoscopy:  PAP:  Bone density:  Lipid panel:  Allergies  Allergen Reactions  . Iodine   . Latex   . Shellfish Allergy     Current Outpatient Medications  Medication Sig Dispense Refill  . ARIPiprazole (ABILIFY) 20 MG tablet Take by mouth.    Marland Kitchen HYDROcodone-acetaminophen (NORCO/VICODIN) 5-325 MG tablet Take 1 tablet by mouth every 6 (six) hours as needed for moderate pain. 90 tablet 0  . metFORMIN (GLUCOPHAGE) 500 MG tablet Take by mouth.    . ondansetron (ZOFRAN) 8 MG tablet Take 1 tablet (8 mg total) by mouth 2 (two) times daily as needed. 60 tablet 2  . prochlorperazine (COMPAZINE) 10 MG tablet Take 1 tablet (10 mg total) by mouth every 6 (six) hours as needed (Nausea or vomiting). 30 tablet 1  . sucralfate (CARAFATE) 1 g tablet Take 1 tablet (1 g total) by mouth 3 (three) times daily. Dissolve in 3-4 tbsp warm water, swish and swallow. 90 tablet 3  . traZODone (DESYREL) 100 MG tablet Please take 1 tablet (100mg ) by mouth nightly for sleep.  You may take another 1/2 tablet (50mg ) by mouth nightly as needed for sleep.     No current facility-administered medications for this visit.     OBJECTIVE: Vitals:   09/15/18 0846  BP: 129/82  Pulse: 76  Temp: (!) 97.3 F (36.3 C)  Body mass index is 44.46 kg/m.    ECOG FS:0 - Asymptomatic  General: Well-developed, well-nourished, no acute distress. Eyes: Pink conjunctiva, anicteric sclera. HEENT: Normocephalic, moist mucous membranes, clear oropharnyx.  Left cervical lymph node decreased in size. Lungs: Clear to auscultation bilaterally. Heart: Regular rate and rhythm. No rubs, murmurs, or gallops. Abdomen: Soft, nontender, nondistended. No organomegaly noted, normoactive bowel sounds.  Musculoskeletal: No edema, cyanosis, or clubbing. Neuro: Alert, answering all questions appropriately. Cranial nerves grossly intact. Skin: No rashes or petechiae noted. Psych: Normal affect.  LAB RESULTS:  Lab Results  Component Value Date   NA 136 09/15/2018   K 4.0 09/15/2018   CL 104 09/15/2018   CO2 25 09/15/2018   GLUCOSE 125 (H) 09/15/2018   BUN 14 09/15/2018   CREATININE 0.83 09/15/2018   CALCIUM 8.9 09/15/2018   GFRNONAA >60 09/15/2018   GFRAA >60 09/15/2018    Lab Results  Component Value Date   WBC 5.4 09/15/2018   NEUTROABS 3.8 09/15/2018   HGB 11.9 (L) 09/15/2018   HCT 35.6 (L) 09/15/2018   MCV 88.6 09/15/2018   PLT 159 09/15/2018     STUDIES: No results found.  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  PLAN:    1.  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  Case also discussed with ENT.  PET scan results from August 09, 2018 reviewed independently confirming stage of disease with no distant metastasis.  We were unable to obtain IV access today, therefore he was unable to receive treatment.  Referral has been made for port placement.  Continue daily XRT.  Return to clinic in 1 week for further evaluation and reconsideration of cycle 4.    2.  Pain: Improved.  Continue hydrocodone as needed. 3.  Paranoid schizophrenia: Continue current medications.  Monitor. 4.  Hyperglycemia: Patient has improved glycemic control.  Continue metformin.  I spent a total of 30 minutes face-to-face with the patient of which greater than 50% of the visit was spent in counseling and coordination of care as detailed above.  Patient expressed understanding and was in agreement with this plan. He also understands that He can call clinic at any time with any questions, concerns, or complaints.   Cancer Staging Squamous cell carcinoma of head and neck ([place]) Staging form: Oral Cavity, AJCC 8th Edition -  Clinical: Stage IVA (cT2, cN2, cM0) - Signed by Lloyd Huger, MD on 08/02/2018   Lloyd Huger, MD   09/16/2018 6:42 AM

## 2018-09-17 ENCOUNTER — Ambulatory Visit: Payer: Medicare Other

## 2018-09-17 LAB — SARS CORONAVIRUS 2 (TAT 6-24 HRS): SARS Coronavirus 2: NEGATIVE

## 2018-09-18 NOTE — Progress Notes (Signed)
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  INTERVAL HISTORY: Patient returns to clinic today for further evaluation and reconsideration of cycle 4 of weekly cisplatin.  He had port placement earlier this week and tolerated the procedure well.  He continues to tolerate his treatments well without significant side effects.  He has some mild dysphasia and neck pain, but states these are improved. He has no neurologic complaints.  He denies any recent fevers or illnesses.  He has a good appetite and denies weight loss.  He denies any chest pain, shortness of breath, cough, or hemoptysis.  He has no nausea, vomiting, constipation, or diarrhea.  He has no urinary complaints.  Patient offers no further specific complaints today.  REVIEW OF SYSTEMS:   Review of Systems  Constitutional: Negative.  Negative for fever, malaise/fatigue and weight loss.  HENT: Positive for sore throat. Negative for sinus pain.   Respiratory: Negative.  Negative for cough, hemoptysis and shortness of breath.   Cardiovascular: Negative.  Negative for chest pain and leg swelling.  Gastrointestinal: Negative.  Negative for abdominal pain.  Genitourinary: Negative.  Negative for dysuria.  Musculoskeletal: Positive for neck pain. Negative for back pain.  Skin: Negative.  Negative for rash.  Neurological: Negative.  Negative for dizziness, speech change, focal weakness, weakness and headaches.  Psychiatric/Behavioral: The patient is nervous/anxious.     As per HPI. Otherwise, a complete review of systems is negative.  PAST MEDICAL HISTORY: Paranoid schizophrenia. Past Medical History:  Diagnosis Date  . Head and neck cancer ([place])     PAST SURGICAL HISTORY:  Cholecystectomy.  FAMILY HISTORY: Family History  Problem Relation Age of Onset  . Colon cancer Father   . Colon cancer Paternal Grandmother     ADVANCED DIRECTIVES (Y/N):  N  HEALTH MAINTENANCE: Social History   Tobacco Use  . Smoking status: Former Smoker    Years: 25.00  . Smokeless tobacco: Former Systems developer    Types: Snuff, Chew  Substance Use Topics  . Alcohol use: Not Currently  . Drug use: Not Currently     Colonoscopy:  PAP:  Bone density:  Lipid panel:  Allergies  Allergen Reactions  . Iodine Swelling  . Shellfish Allergy Swelling  . Latex Rash    Current Outpatient Medications  Medication Sig Dispense Refill  . ARIPiprazole (ABILIFY) 20 MG tablet Take by mouth.    Marland Kitchen HYDROcodone-acetaminophen (NORCO/VICODIN) 5-325 MG tablet Take 1 tablet by mouth every 6 (six) hours as needed for moderate pain. 90 tablet 0  . metFORMIN (GLUCOPHAGE) 500 MG tablet Take by mouth.    . ondansetron (ZOFRAN) 8 MG tablet Take 1 tablet (8 mg total) by mouth 2 (two) times daily as needed. 60 tablet 2  . prochlorperazine (COMPAZINE) 10 MG tablet Take 1 tablet (10 mg total) by mouth every 6 (six) hours as needed (Nausea or vomiting). 30 tablet 1  . traZODone (DESYREL) 100 MG tablet Please take 1 tablet (100mg ) by mouth nightly for sleep.  You may take another 1/2 tablet (50mg ) by mouth nightly as needed for sleep.    . sucralfate (CARAFATE) 1 g tablet Take 1 tablet (1 g total) by mouth 3 (three) times daily. Dissolve in 3-4 tbsp warm water, swish and swallow. (Patient not taking: Reported on  09/20/2018) 90 tablet 3   No current facility-administered medications for this visit.     OBJECTIVE: Vitals:   09/22/18 0846  BP: (!) 150/87  Pulse: 73  Resp: 18  Temp: (!) 96.7 F (35.9 C)     Body mass index is 44.44 kg/m.    ECOG FS:0 - Asymptomatic  General: Well-developed, well-nourished, no acute distress. Eyes: Pink conjunctiva, anicteric sclera. HEENT: Normocephalic, moist mucous  membranes, clear oropharnyx.  Left cervical lymph node minimally palpable. Lungs: Clear to auscultation bilaterally. Heart: Regular rate and rhythm. No rubs, murmurs, or gallops. Abdomen: Soft, nontender, nondistended. No organomegaly noted, normoactive bowel sounds. Musculoskeletal: No edema, cyanosis, or clubbing. Neuro: Alert, answering all questions appropriately. Cranial nerves grossly intact. Skin: No rashes or petechiae noted. Psych: Normal affect.  LAB RESULTS:  Lab Results  Component Value Date   NA 136 09/22/2018   K 3.6 09/22/2018   CL 102 09/22/2018   CO2 24 09/22/2018   GLUCOSE 138 (H) 09/22/2018   BUN 23 (H) 09/22/2018   CREATININE 1.15 09/22/2018   CALCIUM 9.0 09/22/2018   GFRNONAA >60 09/22/2018   GFRAA >60 09/22/2018    Lab Results  Component Value Date   WBC 4.0 09/22/2018   NEUTROABS 2.5 09/22/2018   HGB 12.1 (L) 09/22/2018   HCT 36.1 (L) 09/22/2018   MCV 90.5 09/22/2018   PLT 106 (L) 09/22/2018     STUDIES: No results found.  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  PLAN:    1.  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  Case also discussed with ENT.  PET scan results from August 09, 2018 reviewed independently confirming stage of disease with no distant metastasis.  Patient now has had a port placed and is able to receive treatment.  Continue with daily XRT completing on October 15, 2018.  Proceed with cycle 4 of weekly cisplatin today.  Return to clinic in 1 week for further evaluation and consideration of cycle 5.      2.  Pain: Improved.  Continue hydrocodone as needed. 3.  Paranoid schizophrenia: Continue current medications.  Monitor. 4.  Hyperglycemia: Patient has improved glycemic control.  Continue metformin. 5.  Thrombocytopenia: Mild.  Proceed with treatment as above.  I spent a total of 30 minutes face-to-face with the patient of which greater than 50% of the  visit was spent in counseling and coordination of care as detailed above.   Patient expressed understanding and was in agreement with this plan. He also understands that He can call clinic at any time with any questions, concerns, or complaints.   Cancer Staging Squamous cell carcinoma of head and neck ([place]) Staging form: Oral Cavity, AJCC 8th Edition - Clinical: Stage IVA (cT2, cN2, cM0) - Signed by Lloyd Huger, MD on 08/02/2018   Lloyd Huger, MD   09/23/2018 6:56 AM

## 2018-09-19 MED ORDER — CEFAZOLIN SODIUM-DEXTROSE 2-4 GM/100ML-% IV SOLN
2.0000 g | Freq: Once | INTRAVENOUS | Status: AC
  Administered 2018-09-20: 2 g via INTRAVENOUS

## 2018-09-20 ENCOUNTER — Ambulatory Visit: Payer: Medicare Other

## 2018-09-20 ENCOUNTER — Ambulatory Visit
Admission: RE | Admit: 2018-09-20 | Discharge: 2018-09-20 | Disposition: A | Discharge status: Home / Self Care | Payer: Medicare Other | Attending: Vascular Surgery | Admitting: Vascular Surgery

## 2018-09-20 ENCOUNTER — Encounter
Admission: RE | Disposition: A | Discharge status: Home / Self Care | Payer: Self-pay | Source: Home / Self Care | Attending: Vascular Surgery

## 2018-09-20 ENCOUNTER — Other Ambulatory Visit: Payer: Self-pay

## 2018-09-20 DIAGNOSIS — Z91040 Latex allergy status: Secondary | ICD-10-CM | POA: Insufficient documentation

## 2018-09-20 DIAGNOSIS — R739 Hyperglycemia, unspecified: Secondary | ICD-10-CM | POA: Diagnosis not present

## 2018-09-20 DIAGNOSIS — Z79899 Other long term (current) drug therapy: Secondary | ICD-10-CM | POA: Insufficient documentation

## 2018-09-20 DIAGNOSIS — F200 Paranoid schizophrenia: Secondary | ICD-10-CM | POA: Insufficient documentation

## 2018-09-20 DIAGNOSIS — G893 Neoplasm related pain (acute) (chronic): Secondary | ICD-10-CM | POA: Insufficient documentation

## 2018-09-20 DIAGNOSIS — Z87891 Personal history of nicotine dependence: Secondary | ICD-10-CM | POA: Diagnosis not present

## 2018-09-20 DIAGNOSIS — Z91041 Radiographic dye allergy status: Secondary | ICD-10-CM | POA: Insufficient documentation

## 2018-09-20 DIAGNOSIS — C760 Malignant neoplasm of head, face and neck: Secondary | ICD-10-CM | POA: Insufficient documentation

## 2018-09-20 DIAGNOSIS — Z7984 Long term (current) use of oral hypoglycemic drugs: Secondary | ICD-10-CM | POA: Insufficient documentation

## 2018-09-20 HISTORY — PX: PORTA CATH INSERTION: CATH118285

## 2018-09-20 LAB — GLUCOSE, CAPILLARY: Glucose-Capillary: 102 mg/dL — ABNORMAL HIGH (ref 70–99)

## 2018-09-20 SURGERY — PORTA CATH INSERTION
Anesthesia: Moderate Sedation

## 2018-09-20 MED ORDER — FAMOTIDINE 20 MG PO TABS
ORAL_TABLET | ORAL | Status: AC
  Administered 2018-09-20: 40 mg via ORAL
  Filled 2018-09-20: qty 2

## 2018-09-20 MED ORDER — FENTANYL CITRATE (PF) 100 MCG/2ML IJ SOLN
INTRAMUSCULAR | Status: AC
  Filled 2018-09-20: qty 2

## 2018-09-20 MED ORDER — FENTANYL CITRATE (PF) 100 MCG/2ML IJ SOLN
INTRAMUSCULAR | Status: DC | PRN
  Administered 2018-09-20: 50 ug via INTRAVENOUS
  Administered 2018-09-20: 25 ug via INTRAVENOUS

## 2018-09-20 MED ORDER — MIDAZOLAM HCL 2 MG/ML PO SYRP: 8.0000 mg | ORAL_SOLUTION | Freq: Once | ORAL | Status: DC | PRN

## 2018-09-20 MED ORDER — MIDAZOLAM HCL 5 MG/5ML IJ SOLN
INTRAMUSCULAR | Status: AC
  Filled 2018-09-20: qty 5

## 2018-09-20 MED ORDER — DIPHENHYDRAMINE HCL 50 MG/ML IJ SOLN
INTRAMUSCULAR | Status: AC
  Administered 2018-09-20: 50 mg via INTRAVENOUS
  Filled 2018-09-20: qty 1

## 2018-09-20 MED ORDER — MIDAZOLAM HCL 2 MG/2ML IJ SOLN
INTRAMUSCULAR | Status: DC | PRN
  Administered 2018-09-20: 1 mg via INTRAVENOUS
  Administered 2018-09-20: 2 mg via INTRAVENOUS

## 2018-09-20 MED ORDER — SODIUM CHLORIDE 0.9 % IV SOLN
Freq: Once | INTRAVENOUS | Status: AC
  Administered 2018-09-20: 14:00:00
  Filled 2018-09-20: qty 80

## 2018-09-20 MED ORDER — METHYLPREDNISOLONE SODIUM SUCC 125 MG IJ SOLR
INTRAMUSCULAR | Status: AC
  Administered 2018-09-20: 125 mg via INTRAVENOUS
  Filled 2018-09-20: qty 2

## 2018-09-20 MED ORDER — METHYLPREDNISOLONE SODIUM SUCC 125 MG IJ SOLR
125.0000 mg | Freq: Once | INTRAMUSCULAR | Status: AC | PRN
  Administered 2018-09-20: 125 mg via INTRAVENOUS

## 2018-09-20 MED ORDER — ONDANSETRON HCL 4 MG/2ML IJ SOLN: 4.0000 mg | Freq: Four times a day (QID) | INTRAMUSCULAR | Status: DC | PRN

## 2018-09-20 MED ORDER — SODIUM CHLORIDE 0.9 % IV SOLN
INTRAVENOUS | Status: DC
  Administered 2018-09-20: 12:00:00 via INTRAVENOUS

## 2018-09-20 MED ORDER — FAMOTIDINE 20 MG PO TABS
40.0000 mg | ORAL_TABLET | Freq: Once | ORAL | Status: AC | PRN
  Administered 2018-09-20: 40 mg via ORAL

## 2018-09-20 MED ORDER — DIPHENHYDRAMINE HCL 50 MG/ML IJ SOLN
50.0000 mg | Freq: Once | INTRAMUSCULAR | Status: AC | PRN
  Administered 2018-09-20: 50 mg via INTRAVENOUS

## 2018-09-20 MED ORDER — HYDROMORPHONE HCL 1 MG/ML IJ SOLN: 1.0000 mg | Freq: Once | INTRAMUSCULAR | Status: DC | PRN

## 2018-09-20 SURGICAL SUPPLY — 11 items
DERMABOND ADVANCED (GAUZE/BANDAGES/DRESSINGS) ×1
DERMABOND ADVANCED .7 DNX12 (GAUZE/BANDAGES/DRESSINGS) IMPLANT
KIT PORT POWER 8FR ISP CVUE (Port) ×1 IMPLANT
PACK ANGIOGRAPHY (CUSTOM PROCEDURE TRAY) ×2 IMPLANT
PAD GROUND ADULT SPLIT (MISCELLANEOUS) ×1 IMPLANT
PENCIL ELECTRO HAND CTR (MISCELLANEOUS) ×2 IMPLANT
SPONGE XRAY 4X4 16PLY STRL (MISCELLANEOUS) ×1 IMPLANT
SUT MNCRL AB 4-0 PS2 18 (SUTURE) ×2 IMPLANT
SUT PROLENE 0 CT 1 30 (SUTURE) ×1 IMPLANT
SUT VIC AB 3-0 SH 27 (SUTURE) ×1
SUT VIC AB 3-0 SH 27X BRD (SUTURE) IMPLANT

## 2018-09-20 NOTE — Op Note (Signed)
      [facility]       Operative Note  Date: 09/20/2018  Preoperative diagnosis:  1. Head and neck cancer  Postoperative diagnosis:  Same as above  Procedures: #1. Ultrasound guidance for vascular access to the right internal jugular vein. #2. Fluoroscopic guidance for placement of catheter. #3. Placement of CT compatible Port-A-Cath, right internal jugular vein.  Surgeon: Leotis Pain, MD.   Anesthesia: Local with moderate conscious sedation for approximately 20  minutes using 3 mg of Versed and 75 mcg of Fentanyl  Fluoroscopy time: less than 1 minute  Contrast used: 0  Estimated blood loss: 5 cc  Indication for the procedure:  The patient is a 48 y.o.male with head and neck cancer.  The patient needs a Port-A-Cath for durable venous access, chemotherapy, lab draws, and CT scans. We are asked to place this. Risks and benefits were discussed and informed consent was obtained.  Description of procedure: The patient was brought to the vascular and interventional radiology suite.  Moderate conscious sedation was administered throughout the procedure during a face to face encounter with the patient with my supervision of the RN administering medicines and monitoring the patient's vital signs, pulse oximetry, telemetry and mental status throughout from the start of the procedure until the patient was taken to the recovery room. The right neck chest and shoulder were sterilely prepped and draped, and a sterile surgical field was created. Ultrasound was used to help visualize a patent right internal jugular vein. This was then accessed under direct ultrasound guidance without difficulty with the Seldinger needle and a permanent image was recorded. A J-wire was placed. After skin nick and dilatation, the peel-away sheath was then placed over the wire. I then anesthetized an area under the clavicle approximately 1-2 fingerbreadths. A transverse incision was created and an  inferior pocket was created with electrocautery and blunt dissection. The port was then brought onto the field, placed into the pocket and secured to the chest wall with 2 Prolene sutures. The catheter was connected to the port and tunneled from the subclavicular incision to the access site. Fluoroscopic guidance was then used to cut the catheter to an appropriate length. The catheter was then placed through the peel-away sheath and the peel-away sheath was removed. The catheter tip was parked in excellent location under fluorocoscopic guidance in the SVC just above the right atrium. The pocket was then irrigated with antibiotic impregnated saline and the wound was closed with a running 3-0 Vicryl and a 4-0 Monocryl. The access incision was closed with a single 4-0 Monocryl. The Huber needle was used to withdraw blood and flush the port with heparinized saline. Dermabond was then placed as a dressing. The patient tolerated the procedure well and was taken to the recovery room in stable condition.   Leotis Pain 09/20/2018 2:04 PM   This note was created with Dragon Medical transcription system. Any errors in dictation are purely unintentional.

## 2018-09-20 NOTE — H&P (Signed)
[facility] History & Physical Update  The patient was interviewed and re-examined.  The patient's previous History and Physical has been reviewed and is unchanged.  There is no change in the plan of care. We plan to proceed with the scheduled procedure.  Leotis Pain, MD  09/20/2018, 11:29 AM

## 2018-09-20 NOTE — Discharge Instructions (Signed)
Implanted Port Insertion, Care After °This sheet gives you information about how to care for yourself after your procedure. Your health care provider may also give you more specific instructions. If you have problems or questions, contact your health care provider. °What can I expect after the procedure? °After the procedure, it is common to have: °· Discomfort at the port insertion site. °· Bruising on the skin over the port. This should improve over 3-4 days. °Follow these instructions at home: °Port care °· After your port is placed, you will get a manufacturer's information card. The card has information about your port. Keep this card with you at all times. °· Take care of the port as told by your health care provider. Ask your health care provider if you or a family member can get training for taking care of the port at home. A home health care nurse may also take care of the port. °· Make sure to remember what type of port you have. °Incision care ° °  ° °· Follow instructions from your health care provider about how to take care of your port insertion site. Make sure you: °? Wash your hands with soap and water before and after you change your bandage (dressing). If soap and water are not available, use hand sanitizer. °? Change your dressing as told by your health care provider. °? Leave stitches (sutures), skin glue, or adhesive strips in place. These skin closures may need to stay in place for 2 weeks or longer. If adhesive strip edges start to loosen and curl up, you may trim the loose edges. Do not remove adhesive strips completely unless your health care provider tells you to do that. °· Check your port insertion site every day for signs of infection. Check for: °? Redness, swelling, or pain. °? Fluid or blood. °? Warmth. °? Pus or a bad smell. °Activity °· Return to your normal activities as told by your health care provider. Ask your health care provider what activities are safe for you. °· Do not  lift anything that is heavier than 10 lb (4.5 kg), or the limit that you are told, until your health care provider says that it is safe. °General instructions °· Take over-the-counter and prescription medicines only as told by your health care provider. °· Do not take baths, swim, or use a hot tub until your health care provider approves. Ask your health care provider if you may take showers. You may only be allowed to take sponge baths. °· Do not drive for 24 hours if you were given a sedative during your procedure. °· Wear a medical alert bracelet in case of an emergency. This will tell any health care providers that you have a port. °· Keep all follow-up visits as told by your health care provider. This is important. °Contact a health care provider if: °· You cannot flush your port with saline as directed, or you cannot draw blood from the port. °· You have a fever or chills. °· You have redness, swelling, or pain around your port insertion site. °· You have fluid or blood coming from your port insertion site. °· Your port insertion site feels warm to the touch. °· You have pus or a bad smell coming from the port insertion site. °Get help right away if: °· You have chest pain or shortness of breath. °· You have bleeding from your port that you cannot control. °Summary °· Take care of the port as told by your health   care provider. Keep the manufacturer's information card with you at all times. °· Change your dressing as told by your health care provider. °· Contact a health care provider if you have a fever or chills or if you have redness, swelling, or pain around your port insertion site. °· Keep all follow-up visits as told by your health care provider. °This information is not intended to replace advice given to you by your health care provider. Make sure you discuss any questions you have with your health care provider. °Document Released: 12/08/2012 Document Revised: 09/15/2017 Document Reviewed:  09/15/2017 °Elsevier Patient Education © 2020 [employer] ° °Moderate Conscious Sedation, Adult, Care After °These instructions provide you with information about caring for yourself after your procedure. Your health care provider may also give you more specific instructions. Your treatment has been planned according to current medical practices, but problems sometimes occur. Call your health care provider if you have any problems or questions after your procedure. °What can I expect after the procedure? °After your procedure, it is common: °· To feel sleepy for several hours. °· To feel clumsy and have poor balance for several hours. °· To have poor judgment for several hours. °· To vomit if you eat too soon. °Follow these instructions at home: °For at least 24 hours after the procedure: ° °· Do not: °? Participate in activities where you could fall or become injured. °? Drive. °? Use heavy machinery. °? Drink alcohol. °? Take sleeping pills or medicines that cause drowsiness. °? Make important decisions or sign legal documents. °? Take care of children on your own. °· Rest. °Eating and drinking °· Follow the diet recommended by your health care provider. °· If you vomit: °? Drink water, juice, or soup when you can drink without vomiting. °? Make sure you have little or no nausea before eating solid foods. °General instructions °· Have a responsible adult stay with you until you are awake and alert. °· Take over-the-counter and prescription medicines only as told by your health care provider. °· If you smoke, do not smoke without supervision. °· Keep all follow-up visits as told by your health care provider. This is important. °Contact a health care provider if: °· You keep feeling nauseous or you keep vomiting. °· You feel light-headed. °· You develop a rash. °· You have a fever. °Get help right away if: °· You have trouble breathing. °This information is not intended to replace advice given to you by your  health care provider. Make sure you discuss any questions you have with your health care provider. °Document Released: 12/08/2012 Document Revised: 01/30/2017 Document Reviewed: 06/09/2015 °Elsevier Patient Education © 2020 [employer] ° °

## 2018-09-21 ENCOUNTER — Ambulatory Visit
Admission: RE | Admit: 2018-09-21 | Discharge: 2018-09-21 | Disposition: A | Discharge status: Home / Self Care | Payer: Medicare Other | Source: Ambulatory Visit | Attending: Radiation Oncology | Admitting: Radiation Oncology

## 2018-09-21 ENCOUNTER — Other Ambulatory Visit: Payer: Self-pay

## 2018-09-21 ENCOUNTER — Encounter: Payer: Self-pay | Admitting: Vascular Surgery

## 2018-09-21 DIAGNOSIS — Z510 Encounter for antineoplastic radiation therapy: Secondary | ICD-10-CM | POA: Diagnosis not present

## 2018-09-22 ENCOUNTER — Ambulatory Visit
Admission: RE | Admit: 2018-09-22 | Discharge: 2018-09-22 | Disposition: A | Discharge status: Home / Self Care | Payer: Medicare Other | Source: Ambulatory Visit | Attending: Radiation Oncology | Admitting: Radiation Oncology

## 2018-09-22 ENCOUNTER — Inpatient Hospital Stay (HOSPITAL_BASED_OUTPATIENT_CLINIC_OR_DEPARTMENT_OTHER): Payer: Medicare Other | Admitting: Oncology

## 2018-09-22 ENCOUNTER — Other Ambulatory Visit: Payer: Self-pay

## 2018-09-22 ENCOUNTER — Inpatient Hospital Stay: Payer: Medicare Other | Admitting: *Deleted

## 2018-09-22 ENCOUNTER — Inpatient Hospital Stay: Payer: Medicare Other

## 2018-09-22 VITALS — BP 150/87 | HR 73 | Temp 96.7°F | Resp 18 | Wt 336.8 lb

## 2018-09-22 DIAGNOSIS — R739 Hyperglycemia, unspecified: Secondary | ICD-10-CM

## 2018-09-22 DIAGNOSIS — C760 Malignant neoplasm of head, face and neck: Secondary | ICD-10-CM

## 2018-09-22 DIAGNOSIS — F200 Paranoid schizophrenia: Secondary | ICD-10-CM

## 2018-09-22 DIAGNOSIS — Z5111 Encounter for antineoplastic chemotherapy: Secondary | ICD-10-CM | POA: Diagnosis not present

## 2018-09-22 DIAGNOSIS — Z7984 Long term (current) use of oral hypoglycemic drugs: Secondary | ICD-10-CM

## 2018-09-22 DIAGNOSIS — Z95828 Presence of other vascular implants and grafts: Secondary | ICD-10-CM

## 2018-09-22 DIAGNOSIS — Z79899 Other long term (current) drug therapy: Secondary | ICD-10-CM

## 2018-09-22 DIAGNOSIS — D696 Thrombocytopenia, unspecified: Secondary | ICD-10-CM

## 2018-09-22 DIAGNOSIS — Z510 Encounter for antineoplastic radiation therapy: Secondary | ICD-10-CM | POA: Diagnosis not present

## 2018-09-22 LAB — BASIC METABOLIC PANEL
Anion gap: 10 (ref 5–15)
BUN: 23 mg/dL — ABNORMAL HIGH (ref 6–20)
CO2: 24 mmol/L (ref 22–32)
Calcium: 9 mg/dL (ref 8.9–10.3)
Chloride: 102 mmol/L (ref 98–111)
Creatinine, Ser: 1.15 mg/dL (ref 0.61–1.24)
GFR calc Af Amer: >60 mL/min (ref >60)
GFR calc non Af Amer: >60 mL/min (ref >60)
Glucose, Bld: 138 mg/dL — ABNORMAL HIGH (ref 70–99)
Potassium: 3.6 mmol/L (ref 3.5–5.1)
Sodium: 136 mmol/L (ref 135–145)

## 2018-09-22 LAB — CBC WITH DIFFERENTIAL/PLATELET
Abs Immature Granulocytes: 0.01 10*3/uL (ref 0.00–0.07)
Basophils Absolute: 0 10*3/uL (ref 0.0–0.1)
Basophils Relative: 0 %
Eosinophils Absolute: 0 10*3/uL (ref 0.0–0.5)
Eosinophils Relative: 1 %
HCT: 36.1 % — ABNORMAL LOW (ref 39.0–52.0)
Hemoglobin: 12.1 g/dL — ABNORMAL LOW (ref 13.0–17.0)
Immature Granulocytes: 0 %
Lymphocytes Relative: 29 %
Lymphs Abs: 1.2 10*3/uL (ref 0.7–4.0)
MCH: 30.3 pg (ref 26.0–34.0)
MCHC: 33.5 g/dL (ref 30.0–36.0)
MCV: 90.5 fL (ref 80.0–100.0)
Monocytes Absolute: 0.3 10*3/uL (ref 0.1–1.0)
Monocytes Relative: 8 %
Neutro Abs: 2.5 10*3/uL (ref 1.7–7.7)
Neutrophils Relative %: 62 %
Platelets: 106 10*3/uL — ABNORMAL LOW (ref 150–400)
RBC: 3.99 MIL/uL — ABNORMAL LOW (ref 4.22–5.81)
RDW: 14.7 % (ref 11.5–15.5)
WBC: 4 10*3/uL (ref 4.0–10.5)
nRBC: 0 % (ref 0.0–0.2)

## 2018-09-22 MED ORDER — SODIUM CHLORIDE 0.9 % IV SOLN
Freq: Once | INTRAVENOUS | Status: AC
  Administered 2018-09-22: 11:00:00 via INTRAVENOUS
  Filled 2018-09-22: qty 5

## 2018-09-22 MED ORDER — PALONOSETRON HCL INJECTION 0.25 MG/5ML
0.2500 mg | Freq: Once | INTRAVENOUS | Status: AC
  Administered 2018-09-22: 0.25 mg via INTRAVENOUS
  Filled 2018-09-22: qty 5

## 2018-09-22 MED ORDER — HEPARIN SOD (PORK) LOCK FLUSH 100 UNIT/ML IV SOLN
500.0000 [IU] | Freq: Once | INTRAVENOUS | Status: AC | PRN
  Administered 2018-09-22: 14:00:00 500 [IU]
  Filled 2018-09-22: qty 5

## 2018-09-22 MED ORDER — POTASSIUM CHLORIDE 2 MEQ/ML IV SOLN
Freq: Once | INTRAVENOUS | Status: AC
  Administered 2018-09-22: 09:00:00 via INTRAVENOUS
  Filled 2018-09-22: qty 1000

## 2018-09-22 MED ORDER — SODIUM CHLORIDE 0.9% FLUSH
10.0000 mL | Freq: Once | INTRAVENOUS | Status: AC
  Administered 2018-09-22: 10 mL via INTRAVENOUS
  Filled 2018-09-22: qty 10

## 2018-09-22 MED ORDER — SODIUM CHLORIDE 0.9 % IV SOLN
Freq: Once | INTRAVENOUS | Status: AC
  Administered 2018-09-22: 09:00:00 via INTRAVENOUS
  Filled 2018-09-22: qty 250

## 2018-09-22 MED ORDER — SODIUM CHLORIDE 0.9 % IV SOLN
40.0000 mg/m2 | Freq: Once | INTRAVENOUS | Status: AC
  Administered 2018-09-22: 12:00:00 112 mg via INTRAVENOUS
  Filled 2018-09-22: qty 100

## 2018-09-22 NOTE — Progress Notes (Signed)
Patient does not offer any problems today. Patient had port placed on Monday.

## 2018-09-23 ENCOUNTER — Ambulatory Visit
Admission: RE | Admit: 2018-09-23 | Discharge: 2018-09-23 | Disposition: A | Discharge status: Home / Self Care | Payer: Medicare Other | Source: Ambulatory Visit | Attending: Radiation Oncology | Admitting: Radiation Oncology

## 2018-09-23 ENCOUNTER — Other Ambulatory Visit: Payer: Self-pay

## 2018-09-23 DIAGNOSIS — Z510 Encounter for antineoplastic radiation therapy: Secondary | ICD-10-CM | POA: Diagnosis not present

## 2018-09-24 ENCOUNTER — Ambulatory Visit
Admission: RE | Admit: 2018-09-24 | Discharge: 2018-09-24 | Disposition: A | Discharge status: Home / Self Care | Payer: Medicare Other | Source: Ambulatory Visit | Attending: Radiation Oncology | Admitting: Radiation Oncology

## 2018-09-24 ENCOUNTER — Other Ambulatory Visit: Payer: Self-pay

## 2018-09-24 DIAGNOSIS — Z510 Encounter for antineoplastic radiation therapy: Secondary | ICD-10-CM | POA: Diagnosis not present

## 2018-09-26 NOTE — Progress Notes (Signed)
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  INTERVAL HISTORY: Patient returns to clinic today for further evaluation and consideration of cycle 5 of weekly cisplatin.  He currently feels well and is asymptomatic.  He continues to tolerate his treatments without significant side effects.  He does not complain of dysphasia or neck pain today.  He has no neurologic complaints.  He denies any recent fevers or illnesses.  He has a good appetite and denies weight loss.  He denies any chest pain, shortness of breath, cough, or hemoptysis.  He has no nausea, vomiting, constipation, or diarrhea.  He has no urinary complaints.  Patient offers no specific complaints today.  REVIEW OF SYSTEMS:   Review of Systems  Constitutional: Negative.  Negative for fever, malaise/fatigue and weight loss.  HENT: Negative.  Negative for sinus pain and sore throat.   Respiratory: Negative.  Negative for cough, hemoptysis and shortness of breath.   Cardiovascular: Negative.  Negative for chest pain and leg swelling.  Gastrointestinal: Negative.  Negative for abdominal pain.  Genitourinary: Negative.  Negative for dysuria.  Musculoskeletal: Negative.  Negative for back pain and neck pain.  Skin: Negative.  Negative for rash.  Neurological: Negative.  Negative for dizziness, speech change, focal weakness, weakness and headaches.  Psychiatric/Behavioral: Negative.  The patient is not nervous/anxious.     As per HPI. Otherwise, a complete review of systems is negative.  PAST MEDICAL HISTORY: Paranoid schizophrenia. Past Medical History:  Diagnosis Date  . Head and neck cancer ([employer])     PAST SURGICAL HISTORY: Cholecystectomy.  FAMILY HISTORY: Family History   Problem Relation Age of Onset  . Colon cancer Father   . Colon cancer Paternal Grandmother     ADVANCED DIRECTIVES (Y/N):  N  HEALTH MAINTENANCE: Social History   Tobacco Use  . Smoking status: Former Smoker    Years: 25.00  . Smokeless tobacco: Former Systems developer    Types: Snuff, Chew  Substance Use Topics  . Alcohol use: Not Currently  . Drug use: Not Currently     Colonoscopy:  PAP:  Bone density:  Lipid panel:  Allergies  Allergen Reactions  . Iodine Swelling  . Shellfish Allergy Swelling  . Latex Rash    Current Outpatient Medications  Medication Sig Dispense Refill  . ARIPiprazole (ABILIFY) 20 MG tablet Take by mouth.    Marland Kitchen HYDROcodone-acetaminophen (NORCO/VICODIN) 5-325 MG tablet Take 1 tablet by mouth every 6 (six) hours as needed for moderate pain. 90 tablet 0  . metFORMIN (GLUCOPHAGE) 500 MG tablet Take by mouth.    . ondansetron (ZOFRAN) 8 MG tablet Take 1 tablet (8 mg total) by mouth 2 (two) times daily as needed. 60 tablet 2  . prochlorperazine (COMPAZINE) 10 MG tablet Take 1 tablet (10 mg total) by mouth every 6 (six) hours as needed (Nausea or vomiting). 30 tablet 1  . sucralfate (CARAFATE) 1 g tablet Take 1 tablet (1 g total) by mouth 3 (three) times daily. Dissolve in 3-4 tbsp warm water, swish and swallow. 90 tablet 3  . traZODone (DESYREL) 100 MG tablet Please take 1 tablet (100mg ) by mouth nightly for sleep.  You may take another 1/2 tablet (50mg ) by mouth nightly as needed for sleep.     No current facility-administered medications  for this visit.     OBJECTIVE: Vitals:   09/29/18 0856  BP: 126/82  Pulse: 76  Temp: 97.8 F (36.6 C)     Body mass index is 44.2 kg/m.    ECOG FS:0 - Asymptomatic  General: Well-developed, well-nourished, no acute distress. Eyes: Pink conjunctiva, anicteric sclera. HEENT: Normocephalic, moist mucous membranes, clear oropharnyx.  Left cervical node minimally palpable. Lungs: Clear to auscultation bilaterally. Heart:  Regular rate and rhythm. No rubs, murmurs, or gallops. Abdomen: Soft, nontender, nondistended. No organomegaly noted, normoactive bowel sounds. Musculoskeletal: No edema, cyanosis, or clubbing. Neuro: Alert, answering all questions appropriately. Cranial nerves grossly intact. Skin: No rashes or petechiae noted. Psych: Normal affect.  LAB RESULTS:  Lab Results  Component Value Date   NA 135 09/29/2018   K 3.9 09/29/2018   CL 106 09/29/2018   CO2 24 09/29/2018   GLUCOSE 165 (H) 09/29/2018   BUN 15 09/29/2018   CREATININE 0.95 09/29/2018   CALCIUM 9.0 09/29/2018   GFRNONAA >60 09/29/2018   GFRAA >60 09/29/2018    Lab Results  Component Value Date   WBC 2.7 (L) 09/29/2018   NEUTROABS 1.6 (L) 09/29/2018   HGB 11.7 (L) 09/29/2018   HCT 34.9 (L) 09/29/2018   MCV 90.2 09/29/2018   PLT 82 (L) 09/29/2018     STUDIES: No results found.  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  PLAN:    1.  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  Case also discussed with ENT.  PET scan results from August 09, 2018 reviewed independently confirming stage of disease with no distant metastasis.  Patient now has had a port placed and is able to receive treatment.  Continue with daily XRT completing on October 15, 2018.  Delay cycle 5 of weekly cisplatin today secondary to thrombocytopenia.  Return to clinic in 1 week for further evaluation and reconsideration of treatment.     2.  Pain: Improved.  Continue hydrocodone as needed. 3.  Paranoid schizophrenia: Continue current medications.  Monitor. 4.  Hyperglycemia: Patient has improved glycemic control.  Continue metformin. 5.  Thrombocytopenia: Platelet count has decreased to 82.  Delay treatment as above. 6.  Leukopenia: Secondary to chemotherapy, monitor.  Delay treatment as above.  I spent a total of 30 minutes face-to-face with the patient of which greater than 50%  of the visit was spent in counseling and coordination of care as detailed above.   Patient expressed understanding and was in agreement with this plan. He also understands that He can call clinic at any time with any questions, concerns, or complaints.   Cancer Staging Squamous cell carcinoma of head and neck ([place]) Staging form: Oral Cavity, AJCC 8th Edition - Clinical: Stage IVA (cT2, cN2, cM0) - Signed by Lloyd Huger, MD on 08/02/2018   Lloyd Huger, MD   09/30/2018 7:28 AM

## 2018-09-27 ENCOUNTER — Other Ambulatory Visit: Payer: Self-pay

## 2018-09-27 ENCOUNTER — Ambulatory Visit
Admission: RE | Admit: 2018-09-27 | Discharge: 2018-09-27 | Disposition: A | Discharge status: Home / Self Care | Payer: Medicare Other | Source: Ambulatory Visit | Attending: Radiation Oncology | Admitting: Radiation Oncology

## 2018-09-27 DIAGNOSIS — Z510 Encounter for antineoplastic radiation therapy: Secondary | ICD-10-CM | POA: Diagnosis not present

## 2018-09-28 ENCOUNTER — Ambulatory Visit
Admission: RE | Admit: 2018-09-28 | Discharge: 2018-09-28 | Disposition: A | Discharge status: Home / Self Care | Payer: Medicare Other | Source: Ambulatory Visit | Attending: Radiation Oncology | Admitting: Radiation Oncology

## 2018-09-28 ENCOUNTER — Other Ambulatory Visit: Payer: Self-pay

## 2018-09-28 DIAGNOSIS — Z510 Encounter for antineoplastic radiation therapy: Secondary | ICD-10-CM | POA: Diagnosis not present

## 2018-09-29 ENCOUNTER — Ambulatory Visit
Admission: RE | Admit: 2018-09-29 | Discharge: 2018-09-29 | Disposition: A | Discharge status: Home / Self Care | Payer: Medicare Other | Source: Ambulatory Visit | Attending: Radiation Oncology | Admitting: Radiation Oncology

## 2018-09-29 ENCOUNTER — Inpatient Hospital Stay: Payer: Medicare Other

## 2018-09-29 ENCOUNTER — Inpatient Hospital Stay (HOSPITAL_BASED_OUTPATIENT_CLINIC_OR_DEPARTMENT_OTHER): Payer: Medicare Other | Admitting: Oncology

## 2018-09-29 ENCOUNTER — Other Ambulatory Visit: Payer: Self-pay

## 2018-09-29 ENCOUNTER — Encounter: Payer: Self-pay | Admitting: Oncology

## 2018-09-29 VITALS — BP 126/82 | HR 76 | Temp 97.8°F | Ht 73.0 in | Wt 335.0 lb

## 2018-09-29 DIAGNOSIS — T451X5A Adverse effect of antineoplastic and immunosuppressive drugs, initial encounter: Secondary | ICD-10-CM

## 2018-09-29 DIAGNOSIS — R739 Hyperglycemia, unspecified: Secondary | ICD-10-CM

## 2018-09-29 DIAGNOSIS — C760 Malignant neoplasm of head, face and neck: Secondary | ICD-10-CM

## 2018-09-29 DIAGNOSIS — D701 Agranulocytosis secondary to cancer chemotherapy: Secondary | ICD-10-CM | POA: Diagnosis not present

## 2018-09-29 DIAGNOSIS — D696 Thrombocytopenia, unspecified: Secondary | ICD-10-CM

## 2018-09-29 DIAGNOSIS — Z510 Encounter for antineoplastic radiation therapy: Secondary | ICD-10-CM | POA: Diagnosis not present

## 2018-09-29 DIAGNOSIS — Z79899 Other long term (current) drug therapy: Secondary | ICD-10-CM

## 2018-09-29 DIAGNOSIS — Z5111 Encounter for antineoplastic chemotherapy: Secondary | ICD-10-CM | POA: Diagnosis not present

## 2018-09-29 DIAGNOSIS — Z87891 Personal history of nicotine dependence: Secondary | ICD-10-CM

## 2018-09-29 DIAGNOSIS — F200 Paranoid schizophrenia: Secondary | ICD-10-CM

## 2018-09-29 LAB — BASIC METABOLIC PANEL
Anion gap: 5 (ref 5–15)
BUN: 15 mg/dL (ref 6–20)
CO2: 24 mmol/L (ref 22–32)
Calcium: 9 mg/dL (ref 8.9–10.3)
Chloride: 106 mmol/L (ref 98–111)
Creatinine, Ser: 0.95 mg/dL (ref 0.61–1.24)
GFR calc Af Amer: >60 mL/min (ref >60)
GFR calc non Af Amer: >60 mL/min (ref >60)
Glucose, Bld: 165 mg/dL — ABNORMAL HIGH (ref 70–99)
Potassium: 3.9 mmol/L (ref 3.5–5.1)
Sodium: 135 mmol/L (ref 135–145)

## 2018-09-29 LAB — CBC WITH DIFFERENTIAL/PLATELET
Abs Immature Granulocytes: 0.01 10*3/uL (ref 0.00–0.07)
Basophils Absolute: 0 10*3/uL (ref 0.0–0.1)
Basophils Relative: 0 %
Eosinophils Absolute: 0 10*3/uL (ref 0.0–0.5)
Eosinophils Relative: 2 %
HCT: 34.9 % — ABNORMAL LOW (ref 39.0–52.0)
Hemoglobin: 11.7 g/dL — ABNORMAL LOW (ref 13.0–17.0)
Immature Granulocytes: 0 %
Lymphocytes Relative: 29 %
Lymphs Abs: 0.8 10*3/uL (ref 0.7–4.0)
MCH: 30.2 pg (ref 26.0–34.0)
MCHC: 33.5 g/dL (ref 30.0–36.0)
MCV: 90.2 fL (ref 80.0–100.0)
Monocytes Absolute: 0.2 10*3/uL (ref 0.1–1.0)
Monocytes Relative: 8 %
Neutro Abs: 1.6 10*3/uL — ABNORMAL LOW (ref 1.7–7.7)
Neutrophils Relative %: 61 %
Platelets: 82 10*3/uL — ABNORMAL LOW (ref 150–400)
RBC: 3.87 MIL/uL — ABNORMAL LOW (ref 4.22–5.81)
RDW: 14.6 % (ref 11.5–15.5)
WBC: 2.7 10*3/uL — ABNORMAL LOW (ref 4.0–10.5)
nRBC: 0 % (ref 0.0–0.2)

## 2018-09-29 MED ORDER — SODIUM CHLORIDE 0.9% FLUSH
10.0000 mL | INTRAVENOUS | Status: DC | PRN
  Administered 2018-09-29: 10 mL via INTRAVENOUS
  Filled 2018-09-29: qty 10

## 2018-09-29 MED ORDER — HEPARIN SOD (PORK) LOCK FLUSH 100 UNIT/ML IV SOLN
500.0000 [IU] | Freq: Once | INTRAVENOUS | Status: AC
  Administered 2018-09-29: 09:00:00 500 [IU] via INTRAVENOUS
  Filled 2018-09-29: qty 5

## 2018-09-29 NOTE — Progress Notes (Signed)
Patient stated that he had been doing well with no complaints. 

## 2018-09-30 ENCOUNTER — Ambulatory Visit
Admission: RE | Admit: 2018-09-30 | Discharge: 2018-09-30 | Disposition: A | Discharge status: Home / Self Care | Payer: Medicare Other | Source: Ambulatory Visit | Attending: Radiation Oncology | Admitting: Radiation Oncology

## 2018-09-30 ENCOUNTER — Other Ambulatory Visit: Payer: Self-pay

## 2018-09-30 DIAGNOSIS — Z510 Encounter for antineoplastic radiation therapy: Secondary | ICD-10-CM | POA: Diagnosis not present

## 2018-10-01 ENCOUNTER — Ambulatory Visit
Admission: RE | Admit: 2018-10-01 | Discharge: 2018-10-01 | Disposition: A | Discharge status: Home / Self Care | Payer: Medicare Other | Source: Ambulatory Visit | Attending: Radiation Oncology | Admitting: Radiation Oncology

## 2018-10-01 ENCOUNTER — Other Ambulatory Visit: Payer: Self-pay

## 2018-10-01 DIAGNOSIS — Z510 Encounter for antineoplastic radiation therapy: Secondary | ICD-10-CM | POA: Diagnosis not present

## 2018-10-03 NOTE — Progress Notes (Signed)
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  INTERVAL HISTORY: Patient returns to clinic today for further evaluation and reconsideration of cycle 5 of weekly cisplatin.  He continues to tolerate his treatments well without significant side effects.  He currently feels well and is asymptomatic. He does not complain of dysphasia or neck pain today.  He has no neurologic complaints.  He denies any recent fevers or illnesses.  He has a good appetite and denies weight loss.  He denies any chest pain, shortness of breath, cough, or hemoptysis.  He has no nausea, vomiting, constipation, or diarrhea.  He has no urinary complaints.  Patient offers no specific complaints today.  REVIEW OF SYSTEMS:   Review of Systems  Constitutional: Negative.  Negative for fever, malaise/fatigue and weight loss.  HENT: Negative.  Negative for sinus pain and sore throat.   Respiratory: Negative.  Negative for cough, hemoptysis and shortness of breath.   Cardiovascular: Negative.  Negative for chest pain and leg swelling.  Gastrointestinal: Negative.  Negative for abdominal pain.  Genitourinary: Negative.  Negative for dysuria.  Musculoskeletal: Negative.  Negative for back pain and neck pain.  Skin: Negative.  Negative for rash.  Neurological: Negative.  Negative for dizziness, speech change, focal weakness, weakness and headaches.  Psychiatric/Behavioral: Negative.  The patient is not nervous/anxious.     As per HPI. Otherwise, a complete review of systems is negative.  PAST MEDICAL HISTORY: Paranoid schizophrenia. Past Medical History:  Diagnosis Date  . Head and neck cancer ([place])     PAST SURGICAL HISTORY: Cholecystectomy.  FAMILY HISTORY: Family History   Problem Relation Age of Onset  . Colon cancer Father   . Colon cancer Paternal Grandmother     ADVANCED DIRECTIVES (Y/N):  N  HEALTH MAINTENANCE: Social History   Tobacco Use  . Smoking status: Former Smoker    Years: 25.00  . Smokeless tobacco: Former Systems developer    Types: Snuff, Chew  Substance Use Topics  . Alcohol use: Not Currently  . Drug use: Not Currently     Colonoscopy:  PAP:  Bone density:  Lipid panel:  Allergies  Allergen Reactions  . Iodine Swelling  . Shellfish Allergy Swelling  . Latex Rash    Current Outpatient Medications  Medication Sig Dispense Refill  . ARIPiprazole (ABILIFY) 20 MG tablet Take by mouth.    Marland Kitchen HYDROcodone-acetaminophen (NORCO/VICODIN) 5-325 MG tablet Take 1 tablet by mouth every 6 (six) hours as needed for moderate pain. 90 tablet 0  . metFORMIN (GLUCOPHAGE) 500 MG tablet Take by mouth.    . ondansetron (ZOFRAN) 8 MG tablet Take 1 tablet (8 mg total) by mouth 2 (two) times daily as needed. 60 tablet 2  . prochlorperazine (COMPAZINE) 10 MG tablet Take 1 tablet (10 mg total) by mouth every 6 (six) hours as needed (Nausea or vomiting). 30 tablet 1  . sucralfate (CARAFATE) 1 g tablet Take 1 tablet (1 g total) by mouth 3 (three) times daily. Dissolve in 3-4 tbsp warm water, swish and swallow. 90 tablet 3  . traZODone (DESYREL) 100 MG tablet Please take 1 tablet (100mg ) by mouth nightly for sleep.  You may take another 1/2 tablet (50mg ) by mouth nightly as needed for sleep.     No current facility-administered medications  for this visit.     OBJECTIVE: There were no vitals filed for this visit.   Body mass index is 44.2 kg/m.    ECOG FS:0 - Asymptomatic  General: Well-developed, well-nourished, no acute distress. Eyes: Pink conjunctiva, anicteric sclera. HEENT: Normocephalic, moist mucous membranes, clear oropharnyx.  Left cervical lymph node minimally palpable. Lungs: Clear to auscultation bilaterally. Heart: Regular rate and rhythm. No  rubs, murmurs, or gallops. Abdomen: Soft, nontender, nondistended. No organomegaly noted, normoactive bowel sounds. Musculoskeletal: No edema, cyanosis, or clubbing. Neuro: Alert, answering all questions appropriately. Cranial nerves grossly intact. Skin: No rashes or petechiae noted. Psych: Normal affect.  LAB RESULTS:  Lab Results  Component Value Date   NA 136 10/06/2018   K 3.8 10/06/2018   CL 102 10/06/2018   CO2 25 10/06/2018   GLUCOSE 137 (H) 10/06/2018   BUN 11 10/06/2018   CREATININE 1.02 10/06/2018   CALCIUM 9.2 10/06/2018   GFRNONAA >60 10/06/2018   GFRAA >60 10/06/2018    Lab Results  Component Value Date   WBC 2.5 (L) 10/06/2018   NEUTROABS 1.6 (L) 10/06/2018   HGB 11.7 (L) 10/06/2018   HCT 33.8 (L) 10/06/2018   MCV 90.4 10/06/2018   PLT 140 (L) 10/06/2018     STUDIES: No results found.  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  PLAN:    1.  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  Case also discussed with ENT.  PET scan results from August 09, 2018 reviewed independently confirming stage of disease with no distant metastasis.  Patient now has had a port placed and is able to receive treatment.  Continue with daily XRT completing on October 14, 2018.  Proceed with cycle 5 of weekly cisplatin today.  Return to clinic in 1 week for further evaluation and consideration of cycle 6. 2.  Pain: Patient does not complain of this today.  Continue hydrocodone as needed. 3.  Paranoid schizophrenia: Continue current medications.  Monitor. 4.  Hyperglycemia: Patient has improved glycemic control.  Continue metformin. 5.  Thrombocytopenia: Patient's platelet count has improved to 140.  Proceed with treatment as above. 6.  Leukopenia: Chronic and unchanged, monitor.  Proceed with treatment.  Patient expressed understanding and was in agreement with this plan. He also understands that He can  call clinic at any time with any questions, concerns, or complaints.   Cancer Staging Squamous cell carcinoma of head and neck ([place]) Staging form: Oral Cavity, AJCC 8th Edition - Clinical: Stage IVA (cT2, cN2, cM0) - Signed by Lloyd Huger, MD on 08/02/2018   Lloyd Huger, MD   10/07/2018 6:34 AM

## 2018-10-04 ENCOUNTER — Other Ambulatory Visit: Payer: Self-pay

## 2018-10-04 ENCOUNTER — Ambulatory Visit
Admission: RE | Admit: 2018-10-04 | Discharge: 2018-10-04 | Disposition: A | Discharge status: Home / Self Care | Payer: Medicare Other | Source: Ambulatory Visit | Attending: Radiation Oncology | Admitting: Radiation Oncology

## 2018-10-04 DIAGNOSIS — Z510 Encounter for antineoplastic radiation therapy: Secondary | ICD-10-CM | POA: Diagnosis present

## 2018-10-04 DIAGNOSIS — C060 Malignant neoplasm of cheek mucosa: Secondary | ICD-10-CM | POA: Diagnosis not present

## 2018-10-05 ENCOUNTER — Other Ambulatory Visit: Payer: Self-pay

## 2018-10-05 ENCOUNTER — Ambulatory Visit
Admission: RE | Admit: 2018-10-05 | Discharge: 2018-10-05 | Disposition: A | Discharge status: Home / Self Care | Payer: Medicare Other | Source: Ambulatory Visit | Attending: Radiation Oncology | Admitting: Radiation Oncology

## 2018-10-05 DIAGNOSIS — Z510 Encounter for antineoplastic radiation therapy: Secondary | ICD-10-CM | POA: Diagnosis not present

## 2018-10-06 ENCOUNTER — Ambulatory Visit
Admission: RE | Admit: 2018-10-06 | Discharge: 2018-10-06 | Disposition: A | Discharge status: Home / Self Care | Payer: Medicare Other | Source: Ambulatory Visit | Attending: Radiation Oncology | Admitting: Radiation Oncology

## 2018-10-06 ENCOUNTER — Other Ambulatory Visit: Payer: Self-pay

## 2018-10-06 ENCOUNTER — Inpatient Hospital Stay: Payer: Medicare Other | Attending: Oncology | Admitting: *Deleted

## 2018-10-06 ENCOUNTER — Encounter: Payer: Self-pay | Admitting: Oncology

## 2018-10-06 ENCOUNTER — Inpatient Hospital Stay: Payer: Medicare Other

## 2018-10-06 ENCOUNTER — Inpatient Hospital Stay (HOSPITAL_BASED_OUTPATIENT_CLINIC_OR_DEPARTMENT_OTHER): Payer: Medicare Other | Admitting: Oncology

## 2018-10-06 VITALS — BP 135/83 | HR 76 | Temp 97.9°F | Resp 18

## 2018-10-06 VITALS — Ht 73.0 in | Wt 335.0 lb

## 2018-10-06 DIAGNOSIS — Z5111 Encounter for antineoplastic chemotherapy: Secondary | ICD-10-CM | POA: Insufficient documentation

## 2018-10-06 DIAGNOSIS — D696 Thrombocytopenia, unspecified: Secondary | ICD-10-CM | POA: Insufficient documentation

## 2018-10-06 DIAGNOSIS — Z95828 Presence of other vascular implants and grafts: Secondary | ICD-10-CM

## 2018-10-06 DIAGNOSIS — T451X5A Adverse effect of antineoplastic and immunosuppressive drugs, initial encounter: Secondary | ICD-10-CM | POA: Insufficient documentation

## 2018-10-06 DIAGNOSIS — R739 Hyperglycemia, unspecified: Secondary | ICD-10-CM | POA: Diagnosis not present

## 2018-10-06 DIAGNOSIS — Z79899 Other long term (current) drug therapy: Secondary | ICD-10-CM | POA: Diagnosis not present

## 2018-10-06 DIAGNOSIS — Z510 Encounter for antineoplastic radiation therapy: Secondary | ICD-10-CM | POA: Diagnosis not present

## 2018-10-06 DIAGNOSIS — F200 Paranoid schizophrenia: Secondary | ICD-10-CM | POA: Diagnosis not present

## 2018-10-06 DIAGNOSIS — C760 Malignant neoplasm of head, face and neck: Secondary | ICD-10-CM

## 2018-10-06 DIAGNOSIS — Z87891 Personal history of nicotine dependence: Secondary | ICD-10-CM | POA: Insufficient documentation

## 2018-10-06 DIAGNOSIS — D709 Neutropenia, unspecified: Secondary | ICD-10-CM | POA: Diagnosis not present

## 2018-10-06 LAB — CBC WITH DIFFERENTIAL/PLATELET
Abs Immature Granulocytes: 0 10*3/uL (ref 0.00–0.07)
Basophils Absolute: 0 10*3/uL (ref 0.0–0.1)
Basophils Relative: 0 %
Eosinophils Absolute: 0 10*3/uL (ref 0.0–0.5)
Eosinophils Relative: 1 %
HCT: 33.8 % — ABNORMAL LOW (ref 39.0–52.0)
Hemoglobin: 11.7 g/dL — ABNORMAL LOW (ref 13.0–17.0)
Immature Granulocytes: 0 %
Lymphocytes Relative: 25 %
Lymphs Abs: 0.6 10*3/uL — ABNORMAL LOW (ref 0.7–4.0)
MCH: 31.3 pg (ref 26.0–34.0)
MCHC: 34.6 g/dL (ref 30.0–36.0)
MCV: 90.4 fL (ref 80.0–100.0)
Monocytes Absolute: 0.2 10*3/uL (ref 0.1–1.0)
Monocytes Relative: 9 %
Neutro Abs: 1.6 10*3/uL — ABNORMAL LOW (ref 1.7–7.7)
Neutrophils Relative %: 65 %
Platelets: 140 10*3/uL — ABNORMAL LOW (ref 150–400)
RBC: 3.74 MIL/uL — ABNORMAL LOW (ref 4.22–5.81)
RDW: 15.6 % — ABNORMAL HIGH (ref 11.5–15.5)
WBC: 2.5 10*3/uL — ABNORMAL LOW (ref 4.0–10.5)
nRBC: 0 % (ref 0.0–0.2)

## 2018-10-06 LAB — BASIC METABOLIC PANEL
Anion gap: 9 (ref 5–15)
BUN: 11 mg/dL (ref 6–20)
CO2: 25 mmol/L (ref 22–32)
Calcium: 9.2 mg/dL (ref 8.9–10.3)
Chloride: 102 mmol/L (ref 98–111)
Creatinine, Ser: 1.02 mg/dL (ref 0.61–1.24)
GFR calc Af Amer: >60 mL/min (ref >60)
GFR calc non Af Amer: >60 mL/min (ref >60)
Glucose, Bld: 137 mg/dL — ABNORMAL HIGH (ref 70–99)
Potassium: 3.8 mmol/L (ref 3.5–5.1)
Sodium: 136 mmol/L (ref 135–145)

## 2018-10-06 MED ORDER — PALONOSETRON HCL INJECTION 0.25 MG/5ML
0.2500 mg | Freq: Once | INTRAVENOUS | Status: AC
  Administered 2018-10-06: 12:00:00 0.25 mg via INTRAVENOUS
  Filled 2018-10-06: qty 5

## 2018-10-06 MED ORDER — HEPARIN SOD (PORK) LOCK FLUSH 100 UNIT/ML IV SOLN
500.0000 [IU] | Freq: Once | INTRAVENOUS | Status: AC
  Administered 2018-10-06: 500 [IU] via INTRAVENOUS
  Filled 2018-10-06: qty 5

## 2018-10-06 MED ORDER — SODIUM CHLORIDE 0.9 % IV SOLN
40.0000 mg/m2 | Freq: Once | INTRAVENOUS | Status: AC
  Administered 2018-10-06: 112 mg via INTRAVENOUS
  Filled 2018-10-06: qty 100

## 2018-10-06 MED ORDER — POTASSIUM CHLORIDE 2 MEQ/ML IV SOLN
Freq: Once | INTRAVENOUS | Status: AC
  Administered 2018-10-06: 10:00:00 via INTRAVENOUS
  Filled 2018-10-06: qty 1000

## 2018-10-06 MED ORDER — SODIUM CHLORIDE 0.9% FLUSH
10.0000 mL | Freq: Once | INTRAVENOUS | Status: AC
  Administered 2018-10-06: 09:00:00 10 mL via INTRAVENOUS
  Filled 2018-10-06: qty 10

## 2018-10-06 MED ORDER — SODIUM CHLORIDE 0.9 % IV SOLN
Freq: Once | INTRAVENOUS | Status: AC
  Administered 2018-10-06: 12:00:00 via INTRAVENOUS
  Filled 2018-10-06: qty 5

## 2018-10-06 MED ORDER — SODIUM CHLORIDE 0.9 % IV SOLN
Freq: Once | INTRAVENOUS | Status: AC
  Administered 2018-10-06: 10:00:00 via INTRAVENOUS
  Filled 2018-10-06: qty 250

## 2018-10-06 MED ORDER — HYDROCODONE-ACETAMINOPHEN 5-325 MG PO TABS: 1.0000 | ORAL_TABLET | Freq: Four times a day (QID) | ORAL | 0 refills | Status: DC | PRN

## 2018-10-07 ENCOUNTER — Ambulatory Visit
Admission: RE | Admit: 2018-10-07 | Discharge: 2018-10-07 | Disposition: A | Discharge status: Home / Self Care | Payer: Medicare Other | Source: Ambulatory Visit | Attending: Radiation Oncology | Admitting: Radiation Oncology

## 2018-10-07 ENCOUNTER — Other Ambulatory Visit: Payer: Self-pay

## 2018-10-07 DIAGNOSIS — Z510 Encounter for antineoplastic radiation therapy: Secondary | ICD-10-CM | POA: Diagnosis not present

## 2018-10-08 ENCOUNTER — Ambulatory Visit
Admission: RE | Admit: 2018-10-08 | Discharge: 2018-10-08 | Disposition: A | Discharge status: Home / Self Care | Payer: Medicare Other | Source: Ambulatory Visit | Attending: Radiation Oncology | Admitting: Radiation Oncology

## 2018-10-08 ENCOUNTER — Other Ambulatory Visit: Payer: Self-pay

## 2018-10-08 ENCOUNTER — Other Ambulatory Visit: Payer: Self-pay | Admitting: *Deleted

## 2018-10-08 DIAGNOSIS — Z510 Encounter for antineoplastic radiation therapy: Secondary | ICD-10-CM | POA: Diagnosis not present

## 2018-10-08 MED ORDER — LIDOCAINE VISCOUS HCL 2 % MT SOLN: 15.0000 mL | Freq: Four times a day (QID) | OROMUCOSAL | 1 refills | Status: DC | PRN

## 2018-10-09 NOTE — Progress Notes (Signed)
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  INTERVAL HISTORY: Patient returns to clinic today for further evaluation and consideration of cycle 6 of weekly cisplatin.  His mouth pain is significantly worse this week making it difficult to eat.  He otherwise feels well. He has no neurologic complaints.  He denies any recent fevers or illnesses.  He has a poor appetite, but denies weight loss.  He denies any chest pain, shortness of breath, cough, or hemoptysis.  He has no nausea, vomiting, constipation, or diarrhea.  He has no urinary complaints.  Patient offers no further specific complaints today.  REVIEW OF SYSTEMS:   Review of Systems  Constitutional: Negative.  Negative for fever, malaise/fatigue and weight loss.  HENT: Positive for sore throat. Negative for sinus pain.        Mouth pain  Respiratory: Negative.  Negative for cough, hemoptysis and shortness of breath.   Cardiovascular: Negative.  Negative for chest pain and leg swelling.  Gastrointestinal: Negative.  Negative for abdominal pain.  Genitourinary: Negative.  Negative for dysuria.  Musculoskeletal: Negative.  Negative for back pain and neck pain.  Skin: Negative.  Negative for rash.  Neurological: Negative.  Negative for dizziness, speech change, focal weakness, weakness and headaches.  Psychiatric/Behavioral: Negative.  The patient is not nervous/anxious.     As per HPI. Otherwise, a complete review of systems is negative.  PAST MEDICAL HISTORY: Paranoid schizophrenia. Past Medical History:  Diagnosis Date  . Head and neck cancer ([place])     PAST SURGICAL HISTORY: Cholecystectomy.  FAMILY HISTORY: Family History  Problem Relation Age of Onset  . Colon cancer  Father   . Colon cancer Paternal Grandmother     ADVANCED DIRECTIVES (Y/N):  N  HEALTH MAINTENANCE: Social History   Tobacco Use  . Smoking status: Former Smoker    Years: 25.00  . Smokeless tobacco: Former Systems developer    Types: Snuff, Chew  Substance Use Topics  . Alcohol use: Not Currently  . Drug use: Not Currently     Colonoscopy:  PAP:  Bone density:  Lipid panel:  Allergies  Allergen Reactions  . Iodine Swelling  . Shellfish Allergy Swelling  . Latex Rash    Current Outpatient Medications  Medication Sig Dispense Refill  . ARIPiprazole (ABILIFY) 20 MG tablet Take by mouth.    Marland Kitchen HYDROcodone-acetaminophen (NORCO/VICODIN) 5-325 MG tablet Take 1 tablet by mouth every 6 (six) hours as needed for moderate pain. (Patient taking differently: Take 1-2 tablets by mouth every 6 (six) hours as needed for moderate pain. ) 90 tablet 0  . lidocaine (XYLOCAINE) 2 % solution Use as directed 15 mLs in the mouth or throat every 6 (six) hours as needed for mouth pain. Swish and spit 600 mL 1  . metFORMIN (GLUCOPHAGE) 500 MG tablet Take by mouth.    . ondansetron (ZOFRAN) 8 MG tablet Take 1 tablet (8 mg total) by mouth 2 (two) times daily as needed. 60 tablet 2  . prochlorperazine (COMPAZINE) 10 MG tablet Take 1 tablet (10 mg total) by mouth every 6 (six) hours as needed (Nausea or vomiting). 30 tablet 1  . sucralfate (CARAFATE) 1 g tablet Take 1 tablet (1 g total) by mouth 3 (three) times daily. Dissolve in 3-4 tbsp warm water,  swish and swallow. 90 tablet 3  . traZODone (DESYREL) 100 MG tablet Please take 1 tablet (100mg ) by mouth nightly for sleep.  You may take another 1/2 tablet (50mg ) by mouth nightly as needed for sleep.     No current facility-administered medications for this visit.     OBJECTIVE: Vitals:   10/13/18 0830 10/13/18 0834  BP:  122/80  Pulse:  87  Resp: 18 16  Temp:  97.9 F (36.6 C)  SpO2:  99%     Body mass index is 43.21 kg/m.    ECOG FS:0 - Asymptomatic   General: Well-developed, well-nourished, no acute distress. Eyes: Pink conjunctiva, anicteric sclera. HEENT: Normocephalic, moist mucous membranes, erythematous oropharnyx.  No palpable lymphadenopathy Lungs: Clear to auscultation bilaterally. Heart: Regular rate and rhythm. No rubs, murmurs, or gallops. Abdomen: Soft, nontender, nondistended. No organomegaly noted, normoactive bowel sounds. Musculoskeletal: No edema, cyanosis, or clubbing. Neuro: Alert, answering all questions appropriately. Cranial nerves grossly intact. Skin: No rashes or petechiae noted. Psych: Normal affect.  LAB RESULTS:  Lab Results  Component Value Date   NA 137 10/13/2018   K 3.8 10/13/2018   CL 101 10/13/2018   CO2 27 10/13/2018   GLUCOSE 135 (H) 10/13/2018   BUN 13 10/13/2018   CREATININE 1.01 10/13/2018   CALCIUM 9.5 10/13/2018   GFRNONAA >60 10/13/2018   GFRAA >60 10/13/2018    Lab Results  Component Value Date   WBC 2.3 (L) 10/13/2018   NEUTROABS 1.1 (L) 10/13/2018   HGB 11.4 (L) 10/13/2018   HCT 33.4 (L) 10/13/2018   MCV 90.8 10/13/2018   PLT 167 10/13/2018     STUDIES: No results found.  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  PLAN:    1.  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  Case also discussed with ENT.  PET scan results from August 09, 2018 reviewed independently confirming stage of disease with no distant metastasis.  Patient now has had a port placed and is able to receive treatment.  Continue with daily XRT completing on October 14, 2018.  Proceed with cycle 6 of weekly cisplatin today.  Has now completed his treatments.  Return to clinic in 1 month for repeat laboratory work and further evaluation.  Plan to image approximately 8 to 12 weeks after his last XRT treatment.   2.  Pain: Worse today.  Continue mouthwash.  Patient has been instructed to increase his oxycodone dose for the next week until  his treatments are completed. 3.  Paranoid schizophrenia: Continue current medications.  Monitor. 4.  Hyperglycemia: Patient has improved glycemic control.  Continue metformin. 5.  Thrombocytopenia: Resolved. 6.  Neutropenia: Proceed with patient's last infusion of cisplatin as above.   Patient expressed understanding and was in agreement with this plan. He also understands that He can call clinic at any time with any questions, concerns, or complaints.   Cancer Staging Squamous cell carcinoma of head and neck ([place]) Staging form: Oral Cavity, AJCC 8th Edition - Clinical: Stage IVA (cT2, cN2, cM0) - Signed by Lloyd Huger, MD on 08/02/2018   Lloyd Huger, MD   10/13/2018 3:47 PM

## 2018-10-11 ENCOUNTER — Encounter: Payer: Self-pay | Admitting: Oncology

## 2018-10-11 ENCOUNTER — Ambulatory Visit
Admission: RE | Admit: 2018-10-11 | Discharge: 2018-10-11 | Disposition: A | Discharge status: Home / Self Care | Payer: Medicare Other | Source: Ambulatory Visit | Attending: Radiation Oncology | Admitting: Radiation Oncology

## 2018-10-11 ENCOUNTER — Other Ambulatory Visit: Payer: Self-pay

## 2018-10-11 DIAGNOSIS — Z510 Encounter for antineoplastic radiation therapy: Secondary | ICD-10-CM | POA: Diagnosis not present

## 2018-10-12 ENCOUNTER — Other Ambulatory Visit: Payer: Self-pay

## 2018-10-12 ENCOUNTER — Ambulatory Visit
Admission: RE | Admit: 2018-10-12 | Discharge: 2018-10-12 | Disposition: A | Discharge status: Home / Self Care | Payer: Medicare Other | Source: Ambulatory Visit | Attending: Radiation Oncology | Admitting: Radiation Oncology

## 2018-10-12 ENCOUNTER — Ambulatory Visit: Payer: Medicare Other

## 2018-10-12 DIAGNOSIS — Z510 Encounter for antineoplastic radiation therapy: Secondary | ICD-10-CM | POA: Diagnosis not present

## 2018-10-13 ENCOUNTER — Ambulatory Visit
Admission: RE | Admit: 2018-10-13 | Discharge: 2018-10-13 | Disposition: A | Discharge status: Home / Self Care | Payer: Medicare Other | Source: Ambulatory Visit | Attending: Radiation Oncology | Admitting: Radiation Oncology

## 2018-10-13 ENCOUNTER — Encounter: Payer: Self-pay | Admitting: Oncology

## 2018-10-13 ENCOUNTER — Inpatient Hospital Stay: Payer: Medicare Other | Admitting: *Deleted

## 2018-10-13 ENCOUNTER — Inpatient Hospital Stay (HOSPITAL_BASED_OUTPATIENT_CLINIC_OR_DEPARTMENT_OTHER): Payer: Medicare Other | Admitting: Oncology

## 2018-10-13 ENCOUNTER — Other Ambulatory Visit: Payer: Self-pay

## 2018-10-13 ENCOUNTER — Inpatient Hospital Stay: Payer: Medicare Other

## 2018-10-13 VITALS — BP 122/80 | HR 87 | Temp 97.9°F | Resp 16 | Wt 327.5 lb

## 2018-10-13 DIAGNOSIS — C760 Malignant neoplasm of head, face and neck: Secondary | ICD-10-CM | POA: Diagnosis not present

## 2018-10-13 DIAGNOSIS — Z5111 Encounter for antineoplastic chemotherapy: Secondary | ICD-10-CM | POA: Diagnosis not present

## 2018-10-13 DIAGNOSIS — Z510 Encounter for antineoplastic radiation therapy: Secondary | ICD-10-CM | POA: Diagnosis not present

## 2018-10-13 DIAGNOSIS — Z95828 Presence of other vascular implants and grafts: Secondary | ICD-10-CM

## 2018-10-13 LAB — CBC WITH DIFFERENTIAL/PLATELET
Abs Immature Granulocytes: 0.01 10*3/uL (ref 0.00–0.07)
Basophils Absolute: 0 10*3/uL (ref 0.0–0.1)
Basophils Relative: 0 %
Eosinophils Absolute: 0 10*3/uL (ref 0.0–0.5)
Eosinophils Relative: 1 %
HCT: 33.4 % — ABNORMAL LOW (ref 39.0–52.0)
Hemoglobin: 11.4 g/dL — ABNORMAL LOW (ref 13.0–17.0)
Immature Granulocytes: 0 %
Lymphocytes Relative: 35 %
Lymphs Abs: 0.8 10*3/uL (ref 0.7–4.0)
MCH: 31 pg (ref 26.0–34.0)
MCHC: 34.1 g/dL (ref 30.0–36.0)
MCV: 90.8 fL (ref 80.0–100.0)
Monocytes Absolute: 0.3 10*3/uL (ref 0.1–1.0)
Monocytes Relative: 15 %
Neutro Abs: 1.1 10*3/uL — ABNORMAL LOW (ref 1.7–7.7)
Neutrophils Relative %: 49 %
Platelets: 167 10*3/uL (ref 150–400)
RBC: 3.68 MIL/uL — ABNORMAL LOW (ref 4.22–5.81)
RDW: 15.7 % — ABNORMAL HIGH (ref 11.5–15.5)
WBC: 2.3 10*3/uL — ABNORMAL LOW (ref 4.0–10.5)
nRBC: 0 % (ref 0.0–0.2)

## 2018-10-13 LAB — BASIC METABOLIC PANEL
Anion gap: 9 (ref 5–15)
BUN: 13 mg/dL (ref 6–20)
CO2: 27 mmol/L (ref 22–32)
Calcium: 9.5 mg/dL (ref 8.9–10.3)
Chloride: 101 mmol/L (ref 98–111)
Creatinine, Ser: 1.01 mg/dL (ref 0.61–1.24)
GFR calc Af Amer: >60 mL/min (ref >60)
GFR calc non Af Amer: >60 mL/min (ref >60)
Glucose, Bld: 135 mg/dL — ABNORMAL HIGH (ref 70–99)
Potassium: 3.8 mmol/L (ref 3.5–5.1)
Sodium: 137 mmol/L (ref 135–145)

## 2018-10-13 MED ORDER — SODIUM CHLORIDE 0.9% FLUSH
10.0000 mL | Freq: Once | INTRAVENOUS | Status: AC
  Administered 2018-10-13: 10 mL via INTRAVENOUS
  Filled 2018-10-13: qty 10

## 2018-10-13 MED ORDER — SODIUM CHLORIDE 0.9 % IV SOLN
Freq: Once | INTRAVENOUS | Status: AC
  Administered 2018-10-13: 09:00:00 via INTRAVENOUS
  Filled 2018-10-13: qty 250

## 2018-10-13 MED ORDER — PALONOSETRON HCL INJECTION 0.25 MG/5ML
0.2500 mg | Freq: Once | INTRAVENOUS | Status: AC
  Administered 2018-10-13: 12:00:00 0.25 mg via INTRAVENOUS
  Filled 2018-10-13: qty 5

## 2018-10-13 MED ORDER — SODIUM CHLORIDE 0.9 % IV SOLN
40.0000 mg/m2 | Freq: Once | INTRAVENOUS | Status: AC
  Administered 2018-10-13: 112 mg via INTRAVENOUS
  Filled 2018-10-13: qty 112

## 2018-10-13 MED ORDER — POTASSIUM CHLORIDE 2 MEQ/ML IV SOLN
Freq: Once | INTRAVENOUS | Status: AC
  Administered 2018-10-13: 10:00:00 via INTRAVENOUS
  Filled 2018-10-13: qty 1000

## 2018-10-13 MED ORDER — SODIUM CHLORIDE 0.9 % IV SOLN
Freq: Once | INTRAVENOUS | Status: AC
  Administered 2018-10-13: 12:00:00 via INTRAVENOUS
  Filled 2018-10-13: qty 5

## 2018-10-13 MED ORDER — HEPARIN SOD (PORK) LOCK FLUSH 100 UNIT/ML IV SOLN
500.0000 [IU] | Freq: Once | INTRAVENOUS | Status: AC | PRN
  Administered 2018-10-13: 500 [IU]
  Filled 2018-10-13: qty 5

## 2018-10-13 NOTE — Progress Notes (Signed)
Patient here today for follow up and treatment. He states that the side of his tongue hurts from radiation, making it difficult for him to eat. He is able to drink fluids fairly well. He has been taking two Norco at a time to help with the pain. He denies having nausea, vomiting or diarrhea.

## 2018-10-14 ENCOUNTER — Ambulatory Visit: Payer: Medicare Other

## 2018-10-14 ENCOUNTER — Other Ambulatory Visit: Payer: Self-pay

## 2018-10-14 ENCOUNTER — Ambulatory Visit
Admission: RE | Admit: 2018-10-14 | Discharge: 2018-10-14 | Disposition: A | Discharge status: Home / Self Care | Payer: Medicare Other | Source: Ambulatory Visit | Attending: Radiation Oncology | Admitting: Radiation Oncology

## 2018-10-14 DIAGNOSIS — Z510 Encounter for antineoplastic radiation therapy: Secondary | ICD-10-CM | POA: Diagnosis not present

## 2018-10-15 ENCOUNTER — Ambulatory Visit: Payer: Medicare Other

## 2018-10-20 ENCOUNTER — Ambulatory Visit: Payer: Medicare Other

## 2018-10-20 ENCOUNTER — Other Ambulatory Visit: Payer: Medicare Other

## 2018-10-20 ENCOUNTER — Ambulatory Visit: Payer: Medicare Other | Admitting: Oncology

## 2018-11-05 NOTE — Progress Notes (Signed)
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  INTERVAL HISTORY: Patient returns to clinic today for repeat laboratory work and further evaluation.  He is approximately 1 month out from completing his treatments and feels back to his baseline.  He no longer complains of mouth or neck pain. He has no neurologic complaints.  He denies any recent fevers or illnesses.  He has good appetite and denies weight loss.  He denies any chest pain, shortness of breath, cough, or hemoptysis.  He has no nausea, vomiting, constipation, or diarrhea.  He has no urinary complaints.  Patient offers no specific complaints today.  REVIEW OF SYSTEMS:   Review of Systems  Constitutional: Negative.  Negative for fever, malaise/fatigue and weight loss.  HENT: Negative for sinus pain and sore throat.   Respiratory: Negative.  Negative for cough, hemoptysis and shortness of breath.   Cardiovascular: Negative.  Negative for chest pain and leg swelling.  Gastrointestinal: Negative.  Negative for abdominal pain.  Genitourinary: Negative.  Negative for dysuria.  Musculoskeletal: Negative.  Negative for back pain and neck pain.  Skin: Negative.  Negative for rash.  Neurological: Negative.  Negative for dizziness, speech change, focal weakness, weakness and headaches.  Psychiatric/Behavioral: Negative.  The patient is not nervous/anxious.     As per HPI. Otherwise, a complete review of systems is negative.  PAST MEDICAL HISTORY: Paranoid schizophrenia. Past Medical History:  Diagnosis Date  . Head and neck cancer ([place])     PAST SURGICAL HISTORY: Cholecystectomy.  FAMILY HISTORY: Family History  Problem Relation Age of Onset  . Colon cancer Father   . Colon cancer  Paternal Grandmother     ADVANCED DIRECTIVES (Y/N):  N  HEALTH MAINTENANCE: Social History   Tobacco Use  . Smoking status: Former Smoker    Years: 25.00  . Smokeless tobacco: Former Systems developer    Types: Snuff, Chew  Substance Use Topics  . Alcohol use: Not Currently  . Drug use: Not Currently     Colonoscopy:  PAP:  Bone density:  Lipid panel:  Allergies  Allergen Reactions  . Iodine Swelling  . Shellfish Allergy Swelling  . Latex Rash    Current Outpatient Medications  Medication Sig Dispense Refill  . ARIPiprazole (ABILIFY) 20 MG tablet Take by mouth.    Marland Kitchen HYDROcodone-acetaminophen (NORCO/VICODIN) 5-325 MG tablet Take 1 tablet by mouth every 6 (six) hours as needed for moderate pain. (Patient taking differently: Take 1-2 tablets by mouth every 6 (six) hours as needed for moderate pain. ) 90 tablet 0  . lidocaine (XYLOCAINE) 2 % solution Use as directed 15 mLs in the mouth or throat every 6 (six) hours as needed for mouth pain. Swish and spit 600 mL 1  . metFORMIN (GLUCOPHAGE) 500 MG tablet Take by mouth.    . ondansetron (ZOFRAN) 8 MG tablet Take 1 tablet (8 mg total) by mouth 2 (two) times daily as needed. 60 tablet 2  . prochlorperazine (COMPAZINE) 10 MG tablet Take 1 tablet (10 mg total) by mouth every 6 (six) hours as needed (Nausea or vomiting). 30 tablet 1  . traZODone (DESYREL) 100 MG tablet Please take 1 tablet (100mg ) by mouth nightly for sleep.  You may take another 1/2 tablet (50mg ) by mouth nightly as needed for sleep.    Marland Kitchen  sucralfate (CARAFATE) 1 g tablet Take 1 tablet (1 g total) by mouth 3 (three) times daily. Dissolve in 3-4 tbsp warm water, swish and swallow. (Patient not taking: Reported on 11/11/2018) 90 tablet 3   No current facility-administered medications for this visit.     OBJECTIVE: Vitals:   11/11/18 1603  BP: 138/67  Pulse: 70  Temp: 97.8 F (36.6 C)     Body mass index is 43.44 kg/m.    ECOG FS:0 - Asymptomatic  General: Well-developed,  well-nourished, no acute distress. Eyes: Pink conjunctiva, anicteric sclera. HEENT: Normocephalic, moist mucous membranes, clear oropharnyx.  No palpable lymphadenopathy. Lungs: Clear to auscultation bilaterally. Heart: Regular rate and rhythm. No rubs, murmurs, or gallops. Abdomen: Soft, nontender, nondistended. No organomegaly noted, normoactive bowel sounds. Musculoskeletal: No edema, cyanosis, or clubbing. Neuro: Alert, answering all questions appropriately. Cranial nerves grossly intact. Skin: No rashes or petechiae noted. Psych: Normal affect.  LAB RESULTS:  Lab Results  Component Value Date   NA 140 11/11/2018   K 3.7 11/11/2018   CL 106 11/11/2018   CO2 25 11/11/2018   GLUCOSE 105 (H) 11/11/2018   BUN 16 11/11/2018   CREATININE 1.45 (H) 11/11/2018   CALCIUM 9.3 11/11/2018   GFRNONAA 56 (L) 11/11/2018   GFRAA >60 11/11/2018    Lab Results  Component Value Date   WBC 3.5 (L) 11/11/2018   NEUTROABS 2.1 11/11/2018   HGB 10.9 (L) 11/11/2018   HCT 31.8 (L) 11/11/2018   MCV 96.1 11/11/2018   PLT 123 (L) 11/11/2018     STUDIES: No results found.  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  PLAN:    1.  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  Case also discussed with ENT.  PET scan results from August 09, 2018 reviewed independently confirming stage of disease with no distant metastasis.  Patient now has had a port placed and is able to receive treatment.  Patient received his last dose of cisplatin on October 13, 2018.  No further interventions are needed.  Return to clinic in 6 weeks with repeat PET scan and further evaluation.   2.  Pain: Resolved. 3.  Paranoid schizophrenia: Continue current medications.  Monitor. 4.  Hyperglycemia: Patient has improved glycemic control.  Continue metformin. 5.  Thrombocytopenia: Mild, monitor. 6.  Anemia: Patient's hemoglobin has trended down slightly  to 10.9, monitor. 7.  Renal insufficiency: Patient's creatinine has trended up slightly to 1.45, monitor.  Patient expressed understanding and was in agreement with this plan. He also understands that He can call clinic at any time with any questions, concerns, or complaints.   Cancer Staging Squamous cell carcinoma of head and neck ([place]) Staging form: Oral Cavity, AJCC 8th Edition - Clinical: Stage IVA (cT2, cN2, cM0) - Signed by Lloyd Huger, MD on 08/02/2018   Lloyd Huger, MD   11/12/2018 6:20 AM

## 2018-11-10 NOTE — Progress Notes (Signed)
No answer left message.

## 2018-11-11 ENCOUNTER — Other Ambulatory Visit: Payer: Self-pay

## 2018-11-11 ENCOUNTER — Inpatient Hospital Stay: Payer: Medicare Other | Attending: Oncology

## 2018-11-11 ENCOUNTER — Inpatient Hospital Stay (HOSPITAL_BASED_OUTPATIENT_CLINIC_OR_DEPARTMENT_OTHER): Payer: Medicare Other | Admitting: Oncology

## 2018-11-11 ENCOUNTER — Encounter: Payer: Self-pay | Admitting: Radiation Oncology

## 2018-11-11 ENCOUNTER — Other Ambulatory Visit: Payer: Self-pay | Admitting: *Deleted

## 2018-11-11 ENCOUNTER — Ambulatory Visit
Admission: RE | Admit: 2018-11-11 | Discharge: 2018-11-11 | Disposition: A | Discharge status: Home / Self Care | Payer: Medicare Other | Source: Ambulatory Visit | Attending: Radiation Oncology | Admitting: Radiation Oncology

## 2018-11-11 ENCOUNTER — Encounter: Payer: Self-pay | Admitting: Oncology

## 2018-11-11 VITALS — BP 138/67 | HR 70 | Temp 97.8°F | Wt 329.2 lb

## 2018-11-11 VITALS — BP 142/77 | HR 69 | Temp 97.8°F | Resp 18 | Wt 329.0 lb

## 2018-11-11 DIAGNOSIS — C760 Malignant neoplasm of head, face and neck: Secondary | ICD-10-CM | POA: Diagnosis not present

## 2018-11-11 DIAGNOSIS — Z800 Family history of malignant neoplasm of digestive organs: Secondary | ICD-10-CM | POA: Insufficient documentation

## 2018-11-11 DIAGNOSIS — Z79899 Other long term (current) drug therapy: Secondary | ICD-10-CM | POA: Diagnosis not present

## 2018-11-11 DIAGNOSIS — Z9221 Personal history of antineoplastic chemotherapy: Secondary | ICD-10-CM | POA: Insufficient documentation

## 2018-11-11 DIAGNOSIS — D649 Anemia, unspecified: Secondary | ICD-10-CM | POA: Insufficient documentation

## 2018-11-11 DIAGNOSIS — R739 Hyperglycemia, unspecified: Secondary | ICD-10-CM | POA: Diagnosis not present

## 2018-11-11 DIAGNOSIS — Z87891 Personal history of nicotine dependence: Secondary | ICD-10-CM | POA: Diagnosis not present

## 2018-11-11 DIAGNOSIS — Z452 Encounter for adjustment and management of vascular access device: Secondary | ICD-10-CM | POA: Diagnosis not present

## 2018-11-11 DIAGNOSIS — F200 Paranoid schizophrenia: Secondary | ICD-10-CM | POA: Insufficient documentation

## 2018-11-11 DIAGNOSIS — N289 Disorder of kidney and ureter, unspecified: Secondary | ICD-10-CM | POA: Diagnosis not present

## 2018-11-11 DIAGNOSIS — Z923 Personal history of irradiation: Secondary | ICD-10-CM | POA: Diagnosis not present

## 2018-11-11 DIAGNOSIS — Z7984 Long term (current) use of oral hypoglycemic drugs: Secondary | ICD-10-CM | POA: Diagnosis not present

## 2018-11-11 DIAGNOSIS — C060 Malignant neoplasm of cheek mucosa: Secondary | ICD-10-CM | POA: Diagnosis not present

## 2018-11-11 LAB — BASIC METABOLIC PANEL
Anion gap: 9 (ref 5–15)
BUN: 16 mg/dL (ref 6–20)
CO2: 25 mmol/L (ref 22–32)
Calcium: 9.3 mg/dL (ref 8.9–10.3)
Chloride: 106 mmol/L (ref 98–111)
Creatinine, Ser: 1.45 mg/dL — ABNORMAL HIGH (ref 0.61–1.24)
GFR calc Af Amer: >60 mL/min (ref >60)
GFR calc non Af Amer: 56 mL/min — ABNORMAL LOW (ref >60)
Glucose, Bld: 105 mg/dL — ABNORMAL HIGH (ref 70–99)
Potassium: 3.7 mmol/L (ref 3.5–5.1)
Sodium: 140 mmol/L (ref 135–145)

## 2018-11-11 LAB — CBC WITH DIFFERENTIAL/PLATELET
Abs Immature Granulocytes: 0.01 10*3/uL (ref 0.00–0.07)
Basophils Absolute: 0 10*3/uL (ref 0.0–0.1)
Basophils Relative: 1 %
Eosinophils Absolute: 0.2 10*3/uL (ref 0.0–0.5)
Eosinophils Relative: 4 %
HCT: 31.8 % — ABNORMAL LOW (ref 39.0–52.0)
Hemoglobin: 10.9 g/dL — ABNORMAL LOW (ref 13.0–17.0)
Immature Granulocytes: 0 %
Lymphocytes Relative: 25 %
Lymphs Abs: 0.9 10*3/uL (ref 0.7–4.0)
MCH: 32.9 pg (ref 26.0–34.0)
MCHC: 34.3 g/dL (ref 30.0–36.0)
MCV: 96.1 fL (ref 80.0–100.0)
Monocytes Absolute: 0.4 10*3/uL (ref 0.1–1.0)
Monocytes Relative: 11 %
Neutro Abs: 2.1 10*3/uL (ref 1.7–7.7)
Neutrophils Relative %: 59 %
Platelets: 123 10*3/uL — ABNORMAL LOW (ref 150–400)
RBC: 3.31 MIL/uL — ABNORMAL LOW (ref 4.22–5.81)
RDW: 17.2 % — ABNORMAL HIGH (ref 11.5–15.5)
WBC: 3.5 10*3/uL — ABNORMAL LOW (ref 4.0–10.5)
nRBC: 0 % (ref 0.0–0.2)

## 2018-11-11 MED ORDER — SODIUM CHLORIDE 0.9% FLUSH
10.0000 mL | Freq: Once | INTRAVENOUS | Status: AC
  Administered 2018-11-11: 10 mL via INTRAVENOUS
  Filled 2018-11-11: qty 10

## 2018-11-11 MED ORDER — HEPARIN SOD (PORK) LOCK FLUSH 100 UNIT/ML IV SOLN
500.0000 [IU] | Freq: Once | INTRAVENOUS | Status: AC
  Administered 2018-11-11: 500 [IU] via INTRAVENOUS

## 2018-11-11 NOTE — Progress Notes (Signed)
Radiation Oncology Follow up Note  Name: Lucas Escobar   Date:   11/11/2018 MRN:  [id number] DOB: February 08, 1971    This 48 y.o. 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  Seen today in routine follow-up he is doing well specifically denies head and neck pain dysphasia.  States he is having no problems with mastication.  P.o. intake is good..  COMPLICATIONS OF TREATMENT: none  FOLLOW UP COMPLIANCE: keeps appointments   PHYSICAL EXAM:  BP (!) 142/77   Pulse 69   Temp 97.8 F (36.6 C)   Resp 18   Wt (!) 329 lb (149.2 kg)   BMI 43.41 kg/m  Oral cavity is clear no oral mucosal lesions are identified bucca mucosa seems well-healed.  Neck is clear without evidence of cervical or supraclavicular adenopathy.  Well-developed well-nourished patient in NAD. HEENT reveals PERLA, EOMI, discs not visualized.  Oral cavity is clear. No oral mucosal lesions are identified. Neck is clear without evidence of cervical or supraclavicular adenopathy. Lungs are clear to A&P. Cardiac examination is essentially unremarkable with regular rate and rhythm without murmur rub or thrill. Abdomen is benign with no organomegaly or masses noted. Motor sensory and DTR levels are equal and symmetric in the upper and lower extremities. Cranial nerves II through XII are grossly intact. Proprioception is intact. No peripheral adenopathy or edema is identified. No motor or sensory levels are noted. Crude visual fields are within normal range.  RADIOLOGY RESULTS: I have ordered a PET CT scan in 3 months  PLAN: Present time patient is doing well with no evidence of disease he has had very low side effect profile since completing  radiation therapy.  I have asked to see me in approximately 4 months and will have a PET/CT prior to that visit.  He continues close follow-up care with medical oncology.  I have asked him to make a follow-up appointment in the next several weeks with Dr. Jiles Prows.  Patient knows to call with any concerns.  I would like to take this opportunity to thank you for allowing me to participate in the care of your patient.Noreene Filbert, MD

## 2018-11-11 NOTE — Progress Notes (Signed)
Patient here today for follow up.  Patient denies any nausea, vomiting, diarrhea, constipation, SOB or pain.

## 2018-11-26 ENCOUNTER — Other Ambulatory Visit: Payer: Self-pay | Admitting: Radiation Oncology

## 2018-12-23 ENCOUNTER — Encounter
Admission: RE | Admit: 2018-12-23 | Discharge: 2018-12-23 | Disposition: A | Discharge status: Home / Self Care | Payer: Medicare Other | Source: Ambulatory Visit | Attending: Oncology | Admitting: Oncology

## 2018-12-23 ENCOUNTER — Inpatient Hospital Stay: Payer: Medicare Other | Attending: Oncology

## 2018-12-23 ENCOUNTER — Other Ambulatory Visit: Payer: Self-pay

## 2018-12-23 DIAGNOSIS — Z79899 Other long term (current) drug therapy: Secondary | ICD-10-CM | POA: Insufficient documentation

## 2018-12-23 DIAGNOSIS — Z9221 Personal history of antineoplastic chemotherapy: Secondary | ICD-10-CM | POA: Insufficient documentation

## 2018-12-23 DIAGNOSIS — Z87891 Personal history of nicotine dependence: Secondary | ICD-10-CM | POA: Insufficient documentation

## 2018-12-23 DIAGNOSIS — C760 Malignant neoplasm of head, face and neck: Secondary | ICD-10-CM

## 2018-12-23 DIAGNOSIS — Z923 Personal history of irradiation: Secondary | ICD-10-CM | POA: Diagnosis not present

## 2018-12-23 DIAGNOSIS — R739 Hyperglycemia, unspecified: Secondary | ICD-10-CM | POA: Insufficient documentation

## 2018-12-23 DIAGNOSIS — F200 Paranoid schizophrenia: Secondary | ICD-10-CM | POA: Diagnosis not present

## 2018-12-23 LAB — CBC WITH DIFFERENTIAL/PLATELET
Abs Immature Granulocytes: 0.04 10*3/uL (ref 0.00–0.07)
Basophils Absolute: 0 10*3/uL (ref 0.0–0.1)
Basophils Relative: 0 %
Eosinophils Absolute: 0.3 10*3/uL (ref 0.0–0.5)
Eosinophils Relative: 6 %
HCT: 38.4 % — ABNORMAL LOW (ref 39.0–52.0)
Hemoglobin: 13 g/dL (ref 13.0–17.0)
Immature Granulocytes: 1 %
Lymphocytes Relative: 23 %
Lymphs Abs: 1.1 10*3/uL (ref 0.7–4.0)
MCH: 32.8 pg (ref 26.0–34.0)
MCHC: 33.9 g/dL (ref 30.0–36.0)
MCV: 97 fL (ref 80.0–100.0)
Monocytes Absolute: 0.5 10*3/uL (ref 0.1–1.0)
Monocytes Relative: 9 %
Neutro Abs: 2.9 10*3/uL (ref 1.7–7.7)
Neutrophils Relative %: 61 %
Platelets: 173 10*3/uL (ref 150–400)
RBC: 3.96 MIL/uL — ABNORMAL LOW (ref 4.22–5.81)
RDW: 12.1 % (ref 11.5–15.5)
WBC: 4.9 10*3/uL (ref 4.0–10.5)
nRBC: 0 % (ref 0.0–0.2)

## 2018-12-23 LAB — BASIC METABOLIC PANEL
Anion gap: 8 (ref 5–15)
BUN: 16 mg/dL (ref 6–20)
CO2: 27 mmol/L (ref 22–32)
Calcium: 9.8 mg/dL (ref 8.9–10.3)
Chloride: 105 mmol/L (ref 98–111)
Creatinine, Ser: 1.11 mg/dL (ref 0.61–1.24)
GFR calc Af Amer: >60 mL/min (ref >60)
GFR calc non Af Amer: >60 mL/min (ref >60)
Glucose, Bld: 126 mg/dL — ABNORMAL HIGH (ref 70–99)
Potassium: 4.3 mmol/L (ref 3.5–5.1)
Sodium: 140 mmol/L (ref 135–145)

## 2018-12-23 LAB — GLUCOSE, CAPILLARY: Glucose-Capillary: 127 mg/dL — ABNORMAL HIGH (ref 70–99)

## 2018-12-23 MED ORDER — FLUDEOXYGLUCOSE F - 18 (FDG) INJECTION
16.0000 | Freq: Once | INTRAVENOUS | Status: AC | PRN
  Administered 2018-12-23: 16.53 via INTRAVENOUS

## 2018-12-23 NOTE — Progress Notes (Signed)
Patient is coming in for scan results, wife mentions he is still having blistering on his face from the radiation.   He will call wife during visit

## 2018-12-23 NOTE — Progress Notes (Signed)
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  INTERVAL HISTORY: Patient returns to clinic today for further evaluation and discussion of his PET scan results.  He currently feels well and is asymptomatic.  He no longer complains of any dysphagia or neck pain.  He has no neurologic complaints.  He denies any recent fevers or illnesses.  He has a good appetite and denies weight loss.  He denies any chest pain, shortness of breath, cough, or hemoptysis.  He has no nausea, vomiting, constipation, or diarrhea.  He has no urinary complaints.  Patient feels at his baseline offers no specific complaints today.  REVIEW OF SYSTEMS:   Review of Systems  Constitutional: Negative.  Negative for fever, malaise/fatigue and weight loss.  HENT: Negative for sinus pain and sore throat.   Respiratory: Negative.  Negative for cough, hemoptysis and shortness of breath.   Cardiovascular: Negative.  Negative for chest pain and leg swelling.  Gastrointestinal: Negative.  Negative for abdominal pain.  Genitourinary: Negative.  Negative for dysuria.  Musculoskeletal: Negative.  Negative for back pain and neck pain.  Skin: Negative.  Negative for rash.  Neurological: Negative.  Negative for dizziness, speech change, focal weakness, weakness and headaches.  Psychiatric/Behavioral: Negative.  The patient is not nervous/anxious.     As per HPI. Otherwise, a complete review of systems is negative.  PAST MEDICAL HISTORY: Paranoid schizophrenia. Past Medical History:  Diagnosis Date  . Head and neck cancer ([place])     PAST SURGICAL HISTORY: Cholecystectomy.  FAMILY HISTORY: Family History  Problem Relation Age of Onset  . Colon cancer Father   . Colon cancer  Paternal Grandmother     ADVANCED DIRECTIVES (Y/N):  N  HEALTH MAINTENANCE: Social History   Tobacco Use  . Smoking status: Former Smoker    Years: 25.00  . Smokeless tobacco: Former Systems developer    Types: Snuff, Chew  Substance Use Topics  . Alcohol use: Not Currently  . Drug use: Not Currently     Colonoscopy:  PAP:  Bone density:  Lipid panel:  Allergies  Allergen Reactions  . Iodine Swelling  . Shellfish Allergy Swelling  . Latex Rash    Current Outpatient Medications  Medication Sig Dispense Refill  . ARIPiprazole (ABILIFY) 20 MG tablet Take by mouth.    Marland Kitchen HYDROcodone-acetaminophen (NORCO/VICODIN) 5-325 MG tablet Take 1 tablet by mouth every 6 (six) hours as needed for moderate pain. (Patient taking differently: Take 1-2 tablets by mouth every 6 (six) hours as needed for moderate pain. ) 90 tablet 0  . lidocaine (XYLOCAINE) 2 % solution Use as directed 15 mLs in the mouth or throat every 6 (six) hours as needed for mouth pain. Swish and spit 600 mL 1  . metFORMIN (GLUCOPHAGE) 500 MG tablet Take by mouth.    . ondansetron (ZOFRAN) 8 MG tablet Take 1 tablet (8 mg total) by mouth 2 (two) times daily as needed. 60 tablet 2  . prochlorperazine (COMPAZINE) 10 MG tablet Take 1 tablet (10 mg total) by mouth every 6 (six) hours as needed (Nausea or vomiting). 30 tablet 1  . sucralfate (CARAFATE) 1 g tablet TAKE 1 TABLET BY MOUTH 3 TIMES DAILY. DISSOLVE IN 3-4 TBSP WARM WATER, SWISH AND SWALLOW. 270 tablet 1  . traZODone (  DESYREL) 100 MG tablet Please take 1 tablet (100mg ) by mouth nightly for sleep.  You may take another 1/2 tablet (50mg ) by mouth nightly as needed for sleep.     No current facility-administered medications for this visit.     OBJECTIVE: Vitals:   12/24/18 1013  BP: (!) 142/79  Pulse: 86  Resp: 18  Temp: 98 F (36.7 C)     Body mass index is 44.12 kg/m.    ECOG FS:0 - Asymptomatic  General: Well-developed, well-nourished, no acute distress. Eyes: Pink  conjunctiva, anicteric sclera. HEENT: Normocephalic, moist mucous membranes, clear oropharnyx.  No palpable lymphadenopathy. Lungs: Clear to auscultation bilaterally. Heart: Regular rate and rhythm. No rubs, murmurs, or gallops. Abdomen: Soft, nontender, nondistended. No organomegaly noted, normoactive bowel sounds. Musculoskeletal: No edema, cyanosis, or clubbing. Neuro: Alert, answering all questions appropriately. Cranial nerves grossly intact. Skin: No rashes or petechiae noted. Psych: Normal affect.  LAB RESULTS:  Lab Results  Component Value Date   NA 140 12/23/2018   K 4.3 12/23/2018   CL 105 12/23/2018   CO2 27 12/23/2018   GLUCOSE 126 (H) 12/23/2018   BUN 16 12/23/2018   CREATININE 1.11 12/23/2018   CALCIUM 9.8 12/23/2018   GFRNONAA >60 12/23/2018   GFRAA >60 12/23/2018    Lab Results  Component Value Date   WBC 4.9 12/23/2018   NEUTROABS 2.9 12/23/2018   HGB 13.0 12/23/2018   HCT 38.4 (L) 12/23/2018   MCV 97.0 12/23/2018   PLT 173 12/23/2018     STUDIES: Nm Pet Image Restag (ps) Skull Base To Thigh  Result Date: 12/23/2018 CLINICAL DATA:  Subsequent treatment strategy for squamous cell carcinoma of the head neck. Last chemotherapy and radiation therapy 2 months ago. EXAM: NUCLEAR MEDICINE PET SKULL BASE TO THIGH TECHNIQUE: 16.5 mCi F-18 FDG was injected intravenously. Full-ring PET imaging was performed from the skull base to thigh after the radiotracer. CT data was obtained and used for attenuation correction and anatomic localization. Fasting blood glucose: 127 mg/dl COMPARISON:  08/09/2018 FINDINGS: Mild degradation secondary to patient body habitus. Mediastinal blood pool activity: SUV max 3.2 Liver activity: SUV max NA NECK: The previously described left buccal space hypermetabolic mass has resolved or been resected. The enlarged left level 1B node is significantly improved. Measures on the order of 1.4 x 0.8 cm and a S.U.V. max of 2.6 today. Compare 3.2 x 2.4  cm and a S.U.V. max of 10.6 on the prior. No new cervical nodal hypermetabolism. Incidental CT findings: No cervical adenopathy. Mucosal thickening left maxillary sinus. CHEST: No pulmonary parenchymal or thoracic nodal hypermetabolism. Incidental CT findings: Right Port-A-Cath tip at superior caval/atrial junction. Mild cardiomegaly. ABDOMEN/PELVIS: No abdominopelvic parenchymal or nodal hypermetabolism. Incidental CT findings: Normal adrenal glands. Cholecystectomy. Presumed dystrophic calcification within the midline of the penis. SKELETON: No abnormal marrow activity. Incidental CT findings: None IMPRESSION: 1. Response to therapy of head neck primary. Resolution of left buccal space mass and decrease in size and hypermetabolism of ipsilateral level 1B node. 2. No evidence of extracervical metastatic disease. Electronically Signed   By: Abigail Miyamoto M.D.   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  PLAN:    1.  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  PET scan results from December 23, 2018 reviewed independently and reported as above with no obvious evidence of  recurrent or progressive disease.  Patient also reports a recent normal endoscopy by ENT.  No intervention is needed at this time.  Patient lives in [place] now, therefore will have video assisted telemedicine visit in 3 months for further evaluation.  He has been instructed to keep follow-up with ENT as scheduled.  Will consider reimaging in 6 months.   2.  Pain: Resolved. 3.  Paranoid schizophrenia: Continue current medications.  Monitor. 4.  Hyperglycemia: Patient has improved glycemic control.  Continue metformin. 5.  Thrombocytopenia: Resolved. 6.  Anemia: Resolved. 7.  Renal insufficiency: Resolved.  Patient expressed understanding and was in agreement with  this plan. He also understands that He can call clinic at any time with any questions, concerns, or complaints.   Cancer Staging Squamous cell carcinoma of head and neck ([place]) Staging form: Oral Cavity, AJCC 8th Edition - Clinical: Stage IVA (cT2, cN2, cM0) - Signed by Lloyd Huger, MD on 08/02/2018   Lloyd Huger, MD   12/25/2018 7:04 AM

## 2018-12-24 ENCOUNTER — Inpatient Hospital Stay (HOSPITAL_BASED_OUTPATIENT_CLINIC_OR_DEPARTMENT_OTHER): Payer: Medicare Other | Admitting: Oncology

## 2018-12-24 ENCOUNTER — Encounter: Payer: Self-pay | Admitting: Oncology

## 2018-12-24 ENCOUNTER — Other Ambulatory Visit: Payer: Self-pay

## 2018-12-24 VITALS — BP 142/79 | HR 86 | Temp 98.0°F | Resp 18 | Wt 334.4 lb

## 2018-12-24 DIAGNOSIS — C760 Malignant neoplasm of head, face and neck: Secondary | ICD-10-CM

## 2018-12-24 NOTE — Progress Notes (Signed)
Pt in for follow up, reports having issue with left side of head and face gets hot and red intermittently.

## 2019-01-10 ENCOUNTER — Telehealth: Payer: Self-pay

## 2019-01-10 NOTE — Telephone Encounter (Signed)
Telephone call to patient home and no answer.  Left message and Lucas Escobar, patient wife called and left message so I returned her phone call and told her I would mail the [facility] packet today and given them a call on Nov. 25th at 3:00 to review SCP packet and discuss post cancer care.   Packet mailed

## 2019-01-26 ENCOUNTER — Inpatient Hospital Stay: Payer: Medicare Other | Attending: Oncology

## 2019-03-14 ENCOUNTER — Other Ambulatory Visit: Payer: Self-pay

## 2019-03-15 ENCOUNTER — Encounter: Payer: Self-pay | Admitting: Radiation Oncology

## 2019-03-15 ENCOUNTER — Other Ambulatory Visit: Payer: Self-pay

## 2019-03-15 ENCOUNTER — Ambulatory Visit
Admission: RE | Admit: 2019-03-15 | Discharge: 2019-03-15 | Disposition: A | Discharge status: Home / Self Care | Payer: Medicare Other | Source: Ambulatory Visit | Attending: Radiation Oncology | Admitting: Radiation Oncology

## 2019-03-15 VITALS — BP 123/61 | HR 81 | Temp 98.0°F | Wt 355.0 lb

## 2019-03-15 DIAGNOSIS — Z85818 Personal history of malignant neoplasm of other sites of lip, oral cavity, and pharynx: Secondary | ICD-10-CM | POA: Insufficient documentation

## 2019-03-15 DIAGNOSIS — C760 Malignant neoplasm of head, face and neck: Secondary | ICD-10-CM

## 2019-03-15 DIAGNOSIS — Z08 Encounter for follow-up examination after completed treatment for malignant neoplasm: Secondary | ICD-10-CM | POA: Insufficient documentation

## 2019-03-15 DIAGNOSIS — Z923 Personal history of irradiation: Secondary | ICD-10-CM | POA: Insufficient documentation

## 2019-03-15 DIAGNOSIS — Z85819 Personal history of malignant neoplasm of unspecified site of lip, oral cavity, and pharynx: Secondary | ICD-10-CM | POA: Insufficient documentation

## 2019-03-15 DIAGNOSIS — Z9221 Personal history of antineoplastic chemotherapy: Secondary | ICD-10-CM | POA: Insufficient documentation

## 2019-03-15 LAB — GLUCOSE, CAPILLARY: Glucose-Capillary: 123 mg/dL — ABNORMAL HIGH (ref 70–99)

## 2019-03-15 MED ORDER — FLUDEOXYGLUCOSE F - 18 (FDG) INJECTION
16.0000 | Freq: Once | INTRAVENOUS | Status: AC | PRN
  Administered 2019-03-15: 16.35 via INTRAVENOUS

## 2019-03-15 NOTE — Progress Notes (Signed)
Radiation Oncology Follow up Note  Name: Lucas Escobar   Date:   03/15/2019 MRN:  [id number] DOB: 1970/10/15    This 49 y.o. 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  Seen today in routine follow-up he is doing well specifically denies head and neck pain or dysphagia.  He had a PET scan today which I have reviewed shows no evidence of hypermetabolic [place] he in the head and neck region or anywhere else in his body.  He has had a complete response.  He also saw ENT yesterday had an upper endoscopy again showing no evidence of disease..  COMPLICATIONS OF TREATMENT: none  FOLLOW UP COMPLIANCE: keeps appointments   PHYSICAL EXAM:  BP 123/61   Pulse 81   Temp 98 F (36.7 C)   Wt (!) 355 lb (161 kg)   BMI 46.84 kg/m  No evidence of cervical or supraclavicular adenopathy.  Well-developed well-nourished patient in NAD. HEENT reveals PERLA, EOMI, discs not visualized.  Oral cavity is clear. No oral mucosal lesions are identified. Neck is clear without evidence of cervical or supraclavicular adenopathy. Lungs are clear to A&P. Cardiac examination is essentially unremarkable with regular rate and rhythm without murmur rub or thrill. Abdomen is benign with no organomegaly or masses noted. Motor sensory and DTR levels are equal and symmetric in the upper and lower extremities. Cranial nerves II through XII are grossly intact. Proprioception is intact. No peripheral adenopathy or edema is identified. No motor or sensory levels are noted. Crude visual fields are within normal range.  RADIOLOGY RESULTS: PET CT scan reviewed compatible with above-stated  findings  PLAN: Present time patient is doing well with no evidence of disease 5 months out from concurrent chemoradiation for squamous cell carcinoma the buccal mucosa.  I am pleased with his overall progress.  Of asked to see him back in 6 months for follow-up.  Patient knows to call sooner with any concerns.  He continues close follow-up care with ENT.  I would like to take this opportunity to thank you for allowing me to participate in the care of your patient.Noreene Filbert, MD

## 2019-03-16 ENCOUNTER — Ambulatory Visit: Payer: Medicare Other | Admitting: Radiation Oncology

## 2019-03-16 ENCOUNTER — Ambulatory Visit: Payer: Medicare Other | Admitting: Oncology

## 2019-03-18 NOTE — Progress Notes (Deleted)
[place]  [phone] Fax:(336) [phone]  ID: Lucas Escobar OB: December 25, 1970  MR#: [id number]  [id number]  Patient Care Team: Patient, No Pcp Per as PCP - General (General Practice) Marygrace Drought, MD as Referring Physician (Family Medicine) Dew, Erskine Squibb, MD as Referring Physician (Vascular Surgery) Margaretha Sheffield, MD (Otolaryngology) Lloyd Huger, MD as Consulting Physician (Oncology) Noreene Filbert, MD as Referring Physician (Radiation Oncology)  CHIEF COMPLAINT: Stage IVa squamous cell carcinoma of head and neck.  INTERVAL HISTORY: Patient returns to clinic today for further evaluation and discussion of his PET scan results.  He currently feels well and is asymptomatic.  He no longer complains of any dysphagia or neck pain.  He has no neurologic complaints.  He denies any recent fevers or illnesses.  He has a good appetite and denies weight loss.  He denies any chest pain, shortness of breath, cough, or hemoptysis.  He has no nausea, vomiting, constipation, or diarrhea.  He has no urinary complaints.  Patient feels at his baseline offers no specific complaints today.  REVIEW OF SYSTEMS:   Review of Systems  Constitutional: Negative.  Negative for fever, malaise/fatigue and weight loss.  HENT: Negative for sinus pain and sore throat.   Respiratory: Negative.  Negative for cough, hemoptysis and shortness of breath.   Cardiovascular: Negative.  Negative for chest pain and leg swelling.  Gastrointestinal: Negative.  Negative for abdominal pain.  Genitourinary: Negative.  Negative for dysuria.  Musculoskeletal: Negative.  Negative for back pain and neck pain.  Skin: Negative.  Negative for rash.  Neurological: Negative.  Negative for dizziness, speech change, focal weakness, weakness and headaches.  Psychiatric/Behavioral: Negative.  The patient is not nervous/anxious.     As per HPI. Otherwise, a complete review of systems is  negative.  PAST MEDICAL HISTORY: Paranoid schizophrenia. Past Medical History:  Diagnosis Date  . Head and neck cancer ([place])     PAST SURGICAL HISTORY: Cholecystectomy.  FAMILY HISTORY: Family History  Problem Relation Age of Onset  . Colon cancer Father   . Colon cancer Paternal Grandmother     ADVANCED DIRECTIVES (Y/N):  N  HEALTH MAINTENANCE: Social History   Tobacco Use  . Smoking status: Former Smoker    Years: 25.00  . Smokeless tobacco: Former Systems developer    Types: Snuff, Chew  Substance Use Topics  . Alcohol use: Not Currently  . Drug use: Not Currently     Colonoscopy:  PAP:  Bone density:  Lipid panel:  Allergies  Allergen Reactions  . Iodine Swelling  . Shellfish Allergy Swelling  . Latex Rash    Current Outpatient Medications  Medication Sig Dispense Refill  . ARIPiprazole (ABILIFY) 20 MG tablet Take by mouth.    Marland Kitchen HYDROcodone-acetaminophen (NORCO/VICODIN) 5-325 MG tablet Take 1 tablet by mouth every 6 (six) hours as needed for moderate pain. (Patient taking differently: Take 1-2 tablets by mouth every 6 (six) hours as needed for moderate pain. ) 90 tablet 0  . lidocaine (XYLOCAINE) 2 % solution Use as directed 15 mLs in the mouth or throat every 6 (six) hours as needed for mouth pain. Swish and spit 600 mL 1  . metFORMIN (GLUCOPHAGE) 500 MG tablet Take by mouth.    . ondansetron (ZOFRAN) 8 MG tablet Take 1 tablet (8 mg total) by mouth 2 (two) times daily as needed. 60 tablet 2  . prochlorperazine (COMPAZINE) 10 MG tablet Take 1 tablet (10 mg total) by mouth every 6 (six) hours as  needed (Nausea or vomiting). 30 tablet 1  . sucralfate (CARAFATE) 1 g tablet TAKE 1 TABLET BY MOUTH 3 TIMES DAILY. DISSOLVE IN 3-4 TBSP WARM WATER, SWISH AND SWALLOW. 270 tablet 1  . traZODone (DESYREL) 100 MG tablet Please take 1 tablet (100mg ) by mouth nightly for sleep.  You may take another 1/2 tablet (50mg ) by mouth nightly as needed for sleep.     No current  facility-administered medications for this visit.    OBJECTIVE: There were no vitals filed for this visit.   There is no height or weight on file to calculate BMI.    ECOG FS:0 - Asymptomatic  General: Well-developed, well-nourished, no acute distress. Eyes: Pink conjunctiva, anicteric sclera. HEENT: Normocephalic, moist mucous membranes, clear oropharnyx.  No palpable lymphadenopathy. Lungs: Clear to auscultation bilaterally. Heart: Regular rate and rhythm. No rubs, murmurs, or gallops. Abdomen: Soft, nontender, nondistended. No organomegaly noted, normoactive bowel sounds. Musculoskeletal: No edema, cyanosis, or clubbing. Neuro: Alert, answering all questions appropriately. Cranial nerves grossly intact. Skin: No rashes or petechiae noted. Psych: Normal affect.  LAB RESULTS:  Lab Results  Component Value Date   NA 140 12/23/2018   K 4.3 12/23/2018   CL 105 12/23/2018   CO2 27 12/23/2018   GLUCOSE 126 (H) 12/23/2018   BUN 16 12/23/2018   CREATININE 1.11 12/23/2018   CALCIUM 9.8 12/23/2018   GFRNONAA >60 12/23/2018   GFRAA >60 12/23/2018    Lab Results  Component Value Date   WBC 4.9 12/23/2018   NEUTROABS 2.9 12/23/2018   HGB 13.0 12/23/2018   HCT 38.4 (L) 12/23/2018   MCV 97.0 12/23/2018   PLT 173 12/23/2018     STUDIES: NM PET Image Restag (PS) Skull Base To Thigh  Result Date: 03/15/2019 CLINICAL DATA:  Subsequent treatment strategy for squamous cell carcinoma of the head and neck. EXAM: NUCLEAR MEDICINE PET SKULL BASE TO THIGH TECHNIQUE: 16.35 mCi F-18 FDG was injected intravenously. Full-ring PET imaging was performed from the skull base to thigh after the radiotracer. CT data was obtained and used for attenuation correction and anatomic localization. Fasting blood glucose: 123 mg/dl COMPARISON:  PET-CT 12/23/2018 and 08/09/2018 FINDINGS: Mediastinal blood pool activity: SUV max 2.76 Liver activity: SUV max NA NECK: No residual mass or hypermetabolism in the left  buccal space to suggest residual or recurrent tumor. No enlarged or hypermetabolic lymph nodes are identified. Incidental CT findings: none CHEST: No hypermetabolic mediastinal or hilar nodes. No suspicious pulmonary nodules on the CT scan. Incidental CT findings: none ABDOMEN/PELVIS: No abnormal hypermetabolic activity within the liver, pancreas, adrenal glands, or spleen. No hypermetabolic lymph nodes in the abdomen or pelvis. Incidental CT findings: none SKELETON: No focal hypermetabolic activity to suggest skeletal metastasis. Incidental CT findings: none IMPRESSION: 1. No PET-CT findings for residual or recurrent tumor. No left-sided buccal space mass or enlarged or hypermetabolic neck nodes. 2. No findings for metastatic disease involving the chest, abdomen, pelvis or bony structures. Electronically Signed   By: Marijo Sanes M.D.   On: 03/15/2019 12:51    ASSESSMENT: Stage IVa squamous cell carcinoma of head and neck, p16-.  PLAN:    1.  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  PET scan results from December 23, 2018 reviewed independently and reported as above with no obvious evidence of recurrent or progressive disease.  Patient also reports a recent normal endoscopy by ENT.  No intervention  is needed at this time.  Patient lives in [place] now, therefore will have video assisted telemedicine visit in 3 months for further evaluation.  He has been instructed to keep follow-up with ENT as scheduled.  Will consider reimaging in 6 months.   2.  Pain: Resolved. 3.  Paranoid schizophrenia: Continue current medications.  Monitor. 4.  Hyperglycemia: Patient has improved glycemic control.  Continue metformin. 5.  Thrombocytopenia: Resolved. 6.  Anemia: Resolved. 7.  Renal insufficiency: Resolved.  Patient expressed understanding and was in agreement with this plan. He also  understands that He can call clinic at any time with any questions, concerns, or complaints.   Cancer Staging Squamous cell carcinoma of head and neck ([place]) Staging form: Oral Cavity, AJCC 8th Edition - Clinical: Stage IVA (cT2, cN2, cM0) - Signed by Lloyd Huger, MD on 08/02/2018   Lloyd Huger, MD   03/18/2019 2:45 PM

## 2019-03-25 ENCOUNTER — Other Ambulatory Visit: Payer: Medicare Other

## 2019-03-25 ENCOUNTER — Inpatient Hospital Stay: Payer: Medicare Other | Admitting: Oncology

## 2019-04-01 NOTE — Progress Notes (Signed)
[place]  Telephone:(336) [phone] Fax:(336) [phone]  ID: Lucas Escobar OB: 07/05/1970  MR#: [id number]  [id number]  Patient Care Team: Patient, No Pcp Per as PCP - General (General Practice) Marygrace Drought, MD as Referring Physician (Family Medicine) Lucky Cowboy, Erskine Squibb, MD as Referring Physician (Vascular Surgery) Margaretha Sheffield, MD (Otolaryngology) Lloyd Huger, MD as Consulting Physician (Oncology) Noreene Filbert, MD as Referring Physician (Radiation Oncology)  I connected with Lucas Escobar on 04/05/19 at  1:00 PM EST by video enabled telemedicine visit and verified that I am speaking with the correct person using two identifiers.   I discussed the limitations, risks, security and privacy concerns of performing an evaluation and management service by telemedicine and the availability of in-person appointments. I also discussed with the patient that there may be a patient responsible charge related to this service. The patient expressed understanding and agreed to proceed.   Other persons participating in the visit and their role in the encounter: Patient, MD.  Patient's location: Home. Provider's location: Clinic.  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  INTERVAL HISTORY: Patient agreed to video assisted telemedicine visit for routine 66-month evaluation and discussion of his PET scan results.  He continues to feel well and remains asymptomatic. He has no neurologic complaints.  He denies any recent fevers or illnesses.  He has a good appetite and denies weight loss.  He denies any chest pain, shortness of breath, cough, or hemoptysis.  He has no nausea, vomiting, constipation, or diarrhea.  He has no urinary complaints.  Patient offers no specific complaints today.   REVIEW OF SYSTEMS:   Review of Systems  Constitutional: Negative.  Negative for fever, malaise/fatigue and weight loss.  HENT: Negative for  sinus pain and sore throat.   Respiratory: Negative.  Negative for cough, hemoptysis and shortness of breath.   Cardiovascular: Negative.  Negative for chest pain and leg swelling.  Gastrointestinal: Negative.  Negative for abdominal pain.  Genitourinary: Negative.  Negative for dysuria.  Musculoskeletal: Negative.  Negative for back pain and neck pain.  Skin: Negative.  Negative for rash.  Neurological: Negative.  Negative for dizziness, speech change, focal weakness, weakness and headaches.  Psychiatric/Behavioral: Negative.  The patient is not nervous/anxious.     As per HPI. Otherwise, a complete review of systems is negative.  PAST MEDICAL HISTORY: Paranoid schizophrenia. Past Medical History:  Diagnosis Date  . Head and neck cancer ([place])     PAST SURGICAL HISTORY: Cholecystectomy.  FAMILY HISTORY: Family History  Problem Relation Age of Onset  . Colon cancer Father   . Colon cancer Paternal Grandmother     ADVANCED DIRECTIVES (Y/N):  N  HEALTH MAINTENANCE: Social History   Tobacco Use  . Smoking status: Former Smoker    Years: 25.00  . Smokeless tobacco: Former Systems developer    Types: Snuff, Chew  Substance Use Topics  . Alcohol use: Not Currently  . Drug use: Not Currently     Colonoscopy:  PAP:  Bone density:  Lipid panel:  Allergies  Allergen Reactions  . Iodine Swelling  . Shellfish Allergy Swelling  . Latex Rash    Current Outpatient Medications  Medication Sig Dispense Refill  . ARIPiprazole (ABILIFY) 20 MG tablet Take by mouth.    . doxepin (SINEQUAN) 10 MG/ML solution Take by mouth.    . traZODone (DESYREL) 100 MG tablet Please take 1 tablet (100mg ) by mouth nightly for sleep.  You may take another  1/2 tablet (50mg ) by mouth nightly as needed for sleep.    Marland Kitchen HYDROcodone-acetaminophen (NORCO/VICODIN) 5-325 MG tablet Take 1 tablet by mouth every 6 (six) hours as needed for moderate pain. (Patient not taking: Reported on 04/04/2019) 90 tablet 0  .  lidocaine (XYLOCAINE) 2 % solution Use as directed 15 mLs in the mouth or throat every 6 (six) hours as needed for mouth pain. Swish and spit (Patient not taking: Reported on 04/04/2019) 600 mL 1  . metFORMIN (GLUCOPHAGE) 500 MG tablet Take by mouth.    . ondansetron (ZOFRAN) 8 MG tablet Take 1 tablet (8 mg total) by mouth 2 (two) times daily as needed. (Patient not taking: Reported on 04/04/2019) 60 tablet 2  . prochlorperazine (COMPAZINE) 10 MG tablet Take 1 tablet (10 mg total) by mouth every 6 (six) hours as needed (Nausea or vomiting). (Patient not taking: Reported on 04/04/2019) 30 tablet 1  . sucralfate (CARAFATE) 1 g tablet TAKE 1 TABLET BY MOUTH 3 TIMES DAILY. DISSOLVE IN 3-4 TBSP WARM WATER, SWISH AND SWALLOW. (Patient not taking: Reported on 04/04/2019) 270 tablet 1   No current facility-administered medications for this visit.    OBJECTIVE: There were no vitals filed for this visit.   There is no height or weight on file to calculate BMI.    ECOG FS:0 - Asymptomatic  General: Well-developed, well-nourished, no acute distress. HEENT: Normocephalic.  No obvious lymphadenopathy. Neuro: Alert, answering all questions appropriately. Cranial nerves grossly intact. Psych: Normal affect.   LAB RESULTS:  Lab Results  Component Value Date   NA 140 12/23/2018   K 4.3 12/23/2018   CL 105 12/23/2018   CO2 27 12/23/2018   GLUCOSE 126 (H) 12/23/2018   BUN 16 12/23/2018   CREATININE 1.11 12/23/2018   CALCIUM 9.8 12/23/2018   GFRNONAA >60 12/23/2018   GFRAA >60 12/23/2018    Lab Results  Component Value Date   WBC 4.9 12/23/2018   NEUTROABS 2.9 12/23/2018   HGB 13.0 12/23/2018   HCT 38.4 (L) 12/23/2018   MCV 97.0 12/23/2018   PLT 173 12/23/2018     STUDIES: NM PET Image Restag (PS) Skull Base To Thigh  Result Date: 03/15/2019 CLINICAL DATA:  Subsequent treatment strategy for squamous cell carcinoma of the head and neck. EXAM: NUCLEAR MEDICINE PET SKULL BASE TO THIGH TECHNIQUE:  16.35 mCi F-18 FDG was injected intravenously. Full-ring PET imaging was performed from the skull base to thigh after the radiotracer. CT data was obtained and used for attenuation correction and anatomic localization. Fasting blood glucose: 123 mg/dl COMPARISON:  PET-CT 12/23/2018 and 08/09/2018 FINDINGS: Mediastinal blood pool activity: SUV max 2.76 Liver activity: SUV max NA NECK: No residual mass or hypermetabolism in the left buccal space to suggest residual or recurrent tumor. No enlarged or hypermetabolic lymph nodes are identified. Incidental CT findings: none CHEST: No hypermetabolic mediastinal or hilar nodes. No suspicious pulmonary nodules on the CT scan. Incidental CT findings: none ABDOMEN/PELVIS: No abnormal hypermetabolic activity within the liver, pancreas, adrenal glands, or spleen. No hypermetabolic lymph nodes in the abdomen or pelvis. Incidental CT findings: none SKELETON: No focal hypermetabolic activity to suggest skeletal metastasis. Incidental CT findings: none IMPRESSION: 1. No PET-CT findings for residual or recurrent tumor. No left-sided buccal space mass or enlarged or hypermetabolic neck nodes. 2. No findings for metastatic disease involving the chest, abdomen, pelvis or bony structures. Electronically Signed   By: Marijo Sanes M.D.   On: 03/15/2019 12:51    ASSESSMENT: Stage IVa  squamous cell carcinoma of head and neck, p16-.  PLAN:    1.  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  PET scan results from March 15, 2019 reviewed independently and report as above with no obvious evidence of recurrent or progressive disease.  Patient also reports a normal endoscopy by Dr. Elane Fritz.  No intervention is needed at this time.  No further imaging is necessary unless there is suspicion of recurrent disease.  Patient lives in [place], therefore will have video  assisted telemedicine visit in 3 months for routine evaluation.  If patient remains asymptomatic, he likely can be switched to visits every 6 months at that point.   2.  Pain: Resolved. 3.  Paranoid schizophrenia: Continue current medications.  Monitor.  I provided 20 minutes of face-to-face video visit time during this encounter which included chart review, counseling, and coordination of care as documented above.   Patient expressed understanding and was in agreement with this plan. He also understands that He can call clinic at any time with any questions, concerns, or complaints.   Cancer Staging Squamous cell carcinoma of head and neck ([place]) Staging form: Oral Cavity, AJCC 8th Edition - Clinical: Stage IVA (cT2, cN2, cM0) - Signed by Lloyd Huger, MD on 08/02/2018   Lloyd Huger, MD   04/05/2019 5:29 PM

## 2019-04-04 ENCOUNTER — Encounter: Payer: Self-pay | Admitting: Oncology

## 2019-04-05 ENCOUNTER — Telehealth: Payer: Self-pay | Admitting: Emergency Medicine

## 2019-04-05 ENCOUNTER — Inpatient Hospital Stay: Payer: Medicare Other | Attending: Oncology | Admitting: Oncology

## 2019-04-05 ENCOUNTER — Other Ambulatory Visit: Payer: Self-pay

## 2019-04-05 DIAGNOSIS — Z923 Personal history of irradiation: Secondary | ICD-10-CM | POA: Insufficient documentation

## 2019-04-05 DIAGNOSIS — F200 Paranoid schizophrenia: Secondary | ICD-10-CM | POA: Insufficient documentation

## 2019-04-05 DIAGNOSIS — Z9221 Personal history of antineoplastic chemotherapy: Secondary | ICD-10-CM | POA: Insufficient documentation

## 2019-04-05 DIAGNOSIS — C760 Malignant neoplasm of head, face and neck: Secondary | ICD-10-CM

## 2019-04-05 DIAGNOSIS — Z87891 Personal history of nicotine dependence: Secondary | ICD-10-CM | POA: Insufficient documentation

## 2019-04-05 DIAGNOSIS — Z79899 Other long term (current) drug therapy: Secondary | ICD-10-CM | POA: Insufficient documentation

## 2019-04-05 NOTE — Telephone Encounter (Signed)
Spoke with pt's wife and asked if we could move up virtual visit from 2:45 to 1. Pt's wife said that would be fine. Scheduling team notified.

## 2019-07-02 NOTE — Progress Notes (Signed)
[place]  Telephone:(336) [phone] Fax:(336) [phone]  ID: Lucas Escobar OB: 1970/12/24  MR#: [id number]  [id number]  Patient Care Team: Patient, No Pcp Per as PCP - General (General Practice) Marygrace Drought, MD as Referring Physician (Family Medicine) Lucky Cowboy, Erskine Squibb, MD as Referring Physician (Vascular Surgery) Margaretha Sheffield, MD (Otolaryngology) Lloyd Huger, MD as Consulting Physician (Oncology) Noreene Filbert, MD as Referring Physician (Radiation Oncology)  I connected with Lucas Escobar on 07/05/19 at  2:00 PM EDT by video enabled telemedicine visit and verified that I am speaking with the correct person using two identifiers.   I discussed the limitations, risks, security and privacy concerns of performing an evaluation and management service by telemedicine and the availability of in-person appointments. I also discussed with the patient that there may be a patient responsible charge related to this service. The patient expressed understanding and agreed to proceed.   Other persons participating in the visit and their role in the encounter: Patient, MD.  Patient's location: Home. Provider's location: Clinic.  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  INTERVAL HISTORY: Patient agreed to video assisted telemedicine visit for his routine 50-month evaluation.  He has occasional dry mouth, but otherwise feels well and is asymptomatic. He has no neurologic complaints.  He denies any recent fevers or illnesses.  He has a good appetite and denies weight loss.  He denies any chest pain, shortness of breath, cough, or hemoptysis.  He has no nausea, vomiting, constipation, or diarrhea.  He has no urinary complaints.  Patient offers no further specific complaints today.  REVIEW OF SYSTEMS:   Review of Systems  Constitutional: Negative.  Negative for fever, malaise/fatigue and weight loss.  HENT: Negative for sinus pain  and sore throat.   Respiratory: Negative.  Negative for cough, hemoptysis and shortness of breath.   Cardiovascular: Negative.  Negative for chest pain and leg swelling.  Gastrointestinal: Negative.  Negative for abdominal pain.  Genitourinary: Negative.  Negative for dysuria.  Musculoskeletal: Negative.  Negative for back pain and neck pain.  Skin: Negative.  Negative for rash.  Neurological: Negative.  Negative for dizziness, speech change, focal weakness, weakness and headaches.  Psychiatric/Behavioral: Negative.  The patient is not nervous/anxious.     As per HPI. Otherwise, a complete review of systems is negative.  PAST MEDICAL HISTORY: Paranoid schizophrenia. Past Medical History:  Diagnosis Date  . Head and neck cancer ([place])     PAST SURGICAL HISTORY: Cholecystectomy.  FAMILY HISTORY: Family History  Problem Relation Age of Onset  . Colon cancer Father   . Colon cancer Paternal Grandmother     ADVANCED DIRECTIVES (Y/N):  N  HEALTH MAINTENANCE: Social History   Tobacco Use  . Smoking status: Former Smoker    Years: 25.00  . Smokeless tobacco: Former Systems developer    Types: Snuff, Chew  Substance Use Topics  . Alcohol use: Not Currently  . Drug use: Not Currently     Colonoscopy:  PAP:  Bone density:  Lipid panel:  Allergies  Allergen Reactions  . Iodine Swelling  . Shellfish Allergy Swelling  . Latex Rash    Current Outpatient Medications  Medication Sig Dispense Refill  . ARIPiprazole (ABILIFY) 20 MG tablet Take by mouth.    . doxepin (SINEQUAN) 10 MG/ML solution Take 50 mg by mouth at bedtime.     . metFORMIN (GLUCOPHAGE) 500 MG tablet Take by mouth daily with breakfast.     . sertraline (  ZOLOFT) 50 MG tablet Take 50 mg by mouth daily.     . traZODone (DESYREL) 100 MG tablet 100 mg at bedtime.      No current facility-administered medications for this visit.    OBJECTIVE: There were no vitals filed for this visit.   There is no height or weight on  file to calculate BMI.    ECOG FS:0 - Asymptomatic  General: Well-developed, well-nourished, no acute distress. HEENT: Normocephalic. Neuro: Alert, answering all questions appropriately. Cranial nerves grossly intact. Psych: Normal affect.   LAB RESULTS:  Lab Results  Component Value Date   NA 140 12/23/2018   K 4.3 12/23/2018   CL 105 12/23/2018   CO2 27 12/23/2018   GLUCOSE 126 (H) 12/23/2018   BUN 16 12/23/2018   CREATININE 1.11 12/23/2018   CALCIUM 9.8 12/23/2018   GFRNONAA >60 12/23/2018   GFRAA >60 12/23/2018    Lab Results  Component Value Date   WBC 4.9 12/23/2018   NEUTROABS 2.9 12/23/2018   HGB 13.0 12/23/2018   HCT 38.4 (L) 12/23/2018   MCV 97.0 12/23/2018   PLT 173 12/23/2018     STUDIES: No results found.  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  PLAN:    1.  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  PET scan results from March 15, 2019 reviewed independently with no obvious evidence of recurrent or progressive disease.  Patient had consultation with ENT recently who reported no evidence of disease.  No further imaging is necessary unless there is suspicion of recurrent disease.  Patient lives in [place], therefore will have video assisted telemedicine visit in 3 months for routine evaluation.  At that point, patient will be 1 year removed from completing his treatment and can be transitioned to evaluation every 6 months. 2.  Pain: Resolved. 3.  Paranoid schizophrenia: Continue current medications.  Monitor.  I provided 20 minutes of face-to-face video visit time during this encounter which included chart review, counseling, and coordination of care as documented above.   Patient expressed understanding and was in agreement with this plan. He also understands that He can call clinic at any time with  any questions, concerns, or complaints.   Cancer Staging Squamous cell carcinoma of head and neck ([place]) Staging form: Oral Cavity, AJCC 8th Edition - Clinical: Stage IVA (cT2, cN2, cM0) - Signed by Lloyd Huger, MD on 08/02/2018   Lloyd Huger, MD   07/05/2019 2:39 PM

## 2019-07-05 ENCOUNTER — Encounter: Payer: Self-pay | Admitting: Oncology

## 2019-07-05 ENCOUNTER — Inpatient Hospital Stay: Payer: Medicare Other | Attending: Oncology | Admitting: Oncology

## 2019-07-05 ENCOUNTER — Other Ambulatory Visit: Payer: Self-pay

## 2019-07-05 DIAGNOSIS — Z79899 Other long term (current) drug therapy: Secondary | ICD-10-CM | POA: Insufficient documentation

## 2019-07-05 DIAGNOSIS — Z923 Personal history of irradiation: Secondary | ICD-10-CM | POA: Diagnosis not present

## 2019-07-05 DIAGNOSIS — C760 Malignant neoplasm of head, face and neck: Secondary | ICD-10-CM | POA: Diagnosis present

## 2019-07-05 DIAGNOSIS — F200 Paranoid schizophrenia: Secondary | ICD-10-CM | POA: Insufficient documentation

## 2019-07-05 DIAGNOSIS — Z9221 Personal history of antineoplastic chemotherapy: Secondary | ICD-10-CM | POA: Insufficient documentation

## 2019-07-05 NOTE — Progress Notes (Signed)
Patient prescreened for appointment. Patient has no concerns or questions.  

## 2019-08-15 LAB — SURGICAL PATHOLOGY

## 2019-08-28 ENCOUNTER — Encounter: Payer: Self-pay | Admitting: Radiation Oncology

## 2019-09-09 ENCOUNTER — Other Ambulatory Visit: Payer: Self-pay

## 2019-09-09 ENCOUNTER — Encounter: Payer: Self-pay | Admitting: Radiation Oncology

## 2019-09-09 ENCOUNTER — Ambulatory Visit
Admission: RE | Admit: 2019-09-09 | Discharge: 2019-09-09 | Disposition: A | Discharge status: Home / Self Care | Payer: Medicare Other | Source: Ambulatory Visit | Attending: Radiation Oncology | Admitting: Radiation Oncology

## 2019-09-09 VITALS — BP 139/88 | HR 78 | Temp 96.7°F | Resp 16 | Wt 359.2 lb

## 2019-09-09 DIAGNOSIS — C760 Malignant neoplasm of head, face and neck: Secondary | ICD-10-CM

## 2019-09-09 NOTE — Progress Notes (Signed)
Radiation Oncology Follow up Note  Name: Lucas Escobar   Date:   09/09/2019 MRN:  [id number] DOB: 1971-01-21    This 49 y.o. 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 provider found  HPI: Patient is a 49 year old male now out 11 months having completed concurrent chemoradiation therapy stage IV a squamous cell carcinoma the buccal mucosa.  Seen today in routine follow-up he is doing well.  He states he has some slight fullness in his left face may be related to some lymphedema.  Specifically [place] head and neck pain or dysphagia.  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  COMPLICATIONS OF TREATMENT: none  FOLLOW UP COMPLIANCE: keeps appointments   PHYSICAL EXAM:  BP 139/88 (BP Location: Right Wrist, Patient Position: Sitting, Cuff Size: Small)   Pulse 78   Temp (!) 96.7 F (35.9 C) (Tympanic)   Resp 16   Wt (!) 359 lb 3.2 oz (162.9 kg)   BMI 47.39 kg/m  Oral cavity is clear no oral mucosal lesions are identified.  Neck is clear without evidence of cervical or supraclavicular adenopathy.  Well-developed well-nourished patient in NAD. HEENT reveals PERLA, EOMI, discs not visualized.  Oral cavity is clear. No oral mucosal lesions are identified. Neck is clear without evidence of cervical or supraclavicular adenopathy. Lungs are clear to A&P. Cardiac examination is essentially unremarkable with regular rate and rhythm without murmur rub or thrill. Abdomen is benign with no organomegaly or masses noted. Motor sensory and DTR levels are equal and symmetric in the upper and lower extremities. Cranial nerves II through XII are grossly intact. Proprioception is intact. No peripheral adenopathy or edema  is identified. No motor or sensory levels are noted. Crude visual fields are within normal range.  RADIOLOGY RESULTS: PET CT scans reviewed compatible with above-stated findings  PLAN: Present time patient is doing well with no evidence of disease being treated for some topical leukoplakia by Dr. Gibson Ramp.  He is seeing him on a regular basis I am to decrease my follow-up visits to 1 year.  Patient knows to call at anytime with any concerns.  I would like to take this opportunity to thank you for allowing me to participate in the care of your patient.Noreene Filbert, MD

## 2019-09-12 ENCOUNTER — Ambulatory Visit: Payer: Medicare Other | Admitting: Radiation Oncology

## 2019-10-02 NOTE — Progress Notes (Signed)
[place]  Telephone:(336) [phone] Fax:(336) [phone]  ID: Lucas Escobar OB: 1971-02-17  MR#: [id number]  DXA#:[id number]  Patient Care Team: Patient, No Pcp Per as PCP - General (General Practice) Marygrace Drought, MD as Referring Physician (Family Medicine) Lucky Cowboy, Erskine Squibb, MD as Referring Physician (Vascular Surgery) Margaretha Sheffield, MD (Otolaryngology) Lloyd Huger, MD as Consulting Physician (Oncology) Noreene Filbert, MD as Referring Physician (Radiation Oncology)  I connected with Lucas Escobar on 10/02/19 at  1:30 PM EDT by video enabled telemedicine visit and verified that I am speaking with the correct person using two identifiers.   I discussed the limitations, risks, security and privacy concerns of performing an evaluation and management service by telemedicine and the availability of in-person appointments. I also discussed with the patient that there may be a patient responsible charge related to this service. The patient expressed understanding and agreed to proceed.   Other persons participating in the visit and their role in the encounter: Patient, MD.  Patient's location: Home. Provider's location: Clinic.  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  INTERVAL HISTORY: Patient agreed to video assisted telemedicine visit for routine 59-month evaluation.  He currently feels well and is asymptomatic. He has no neurologic complaints.  He denies any recent fevers or illnesses.  He has a good appetite and denies weight loss.  He denies any chest pain, shortness of breath, cough, or hemoptysis.  He has no nausea, vomiting, constipation, or diarrhea.  He has no urinary complaints.  Patient feels that his baseline offers no specific complaints today.  REVIEW OF SYSTEMS:   Review of Systems  Constitutional: Negative.  Negative for fever, malaise/fatigue and weight loss.  HENT: Negative for sinus pain and sore throat.    Respiratory: Negative.  Negative for cough, hemoptysis and shortness of breath.   Cardiovascular: Negative.  Negative for chest pain and leg swelling.  Gastrointestinal: Negative.  Negative for abdominal pain.  Genitourinary: Negative.  Negative for dysuria.  Musculoskeletal: Negative.  Negative for back pain and neck pain.  Skin: Negative.  Negative for rash.  Neurological: Negative.  Negative for dizziness, speech change, focal weakness, weakness and headaches.  Psychiatric/Behavioral: Negative.  The patient is not nervous/anxious.     As per HPI. Otherwise, a complete review of systems is negative.  PAST MEDICAL HISTORY: Paranoid schizophrenia. Past Medical History:  Diagnosis Date  . Head and neck cancer ([place])     PAST SURGICAL HISTORY: Cholecystectomy.  FAMILY HISTORY: Family History  Problem Relation Age of Onset  . Colon cancer Father   . Colon cancer Paternal Grandmother     ADVANCED DIRECTIVES (Y/N):  N  HEALTH MAINTENANCE: Social History   Tobacco Use  . Smoking status: Former Smoker    Years: 25.00  . Smokeless tobacco: Former Systems developer    Types: Snuff, Chew  Substance Use Topics  . Alcohol use: Not Currently  . Drug use: Not Currently     Colonoscopy:  PAP:  Bone density:  Lipid panel:  Allergies  Allergen Reactions  . Iodine Swelling  . Shellfish Allergy Swelling  . Latex Rash    Current Outpatient Medications  Medication Sig Dispense Refill  . ARIPiprazole (ABILIFY) 20 MG tablet Take by mouth.    . doxepin (SINEQUAN) 10 MG/ML solution Take 50 mg by mouth at bedtime.     . metFORMIN (GLUCOPHAGE) 500 MG tablet Take by mouth daily with breakfast.     . sertraline (ZOLOFT) 50 MG  tablet Take 50 mg by mouth daily.     . traZODone (DESYREL) 100 MG tablet 100 mg at bedtime.      No current facility-administered medications for this visit.    OBJECTIVE: There were no vitals filed for this visit.   There is no height or weight on file to calculate  BMI.    ECOG FS:0 - Asymptomatic  General: Well-developed, well-nourished, no acute distress. HEENT: Normocephalic. Neuro: Alert, answering all questions appropriately. Cranial nerves grossly intact. Psych: Normal affect.  LAB RESULTS:  Lab Results  Component Value Date   NA 140 12/23/2018   K 4.3 12/23/2018   CL 105 12/23/2018   CO2 27 12/23/2018   GLUCOSE 126 (H) 12/23/2018   BUN 16 12/23/2018   CREATININE 1.11 12/23/2018   CALCIUM 9.8 12/23/2018   GFRNONAA >60 12/23/2018   GFRAA >60 12/23/2018    Lab Results  Component Value Date   WBC 4.9 12/23/2018   NEUTROABS 2.9 12/23/2018   HGB 13.0 12/23/2018   HCT 38.4 (L) 12/23/2018   MCV 97.0 12/23/2018   PLT 173 12/23/2018     STUDIES: No results found.  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  PLAN:    1.  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  PET scan results from March 15, 2019 reviewed independently with no obvious evidence of recurrent or progressive disease.  Patient had consultation with ENT recently who reported no evidence of disease.  No further imaging is necessary unless there is suspicion of recurrent disease.  Return to clinic in 6 months with video assisted telemedicine visit for routine evaluation.   2.  Pain: Resolved. 3.  Paranoid schizophrenia: Continue current medications.  Monitor.  I provided 20 minutes of face-to-face video visit time during this encounter which included chart review, counseling, and coordination of care as documented above.   Patient expressed understanding and was in agreement with this plan. He also understands that He can call clinic at any time with any questions, concerns, or complaints.   Cancer Staging Squamous cell carcinoma of head and neck ([place]) Staging form: Oral Cavity, AJCC 8th Edition - Clinical: Stage IVA (cT2, cN2,  cM0) - Signed by Lloyd Huger, MD on 08/02/2018   Lloyd Huger, MD   10/02/2019 9:28 AM

## 2019-10-04 ENCOUNTER — Inpatient Hospital Stay: Payer: Medicare Other | Attending: Oncology | Admitting: Oncology

## 2019-10-04 DIAGNOSIS — Z9221 Personal history of antineoplastic chemotherapy: Secondary | ICD-10-CM | POA: Insufficient documentation

## 2019-10-04 DIAGNOSIS — C760 Malignant neoplasm of head, face and neck: Secondary | ICD-10-CM | POA: Insufficient documentation

## 2019-10-04 DIAGNOSIS — Z79899 Other long term (current) drug therapy: Secondary | ICD-10-CM | POA: Insufficient documentation

## 2019-10-04 DIAGNOSIS — F200 Paranoid schizophrenia: Secondary | ICD-10-CM | POA: Insufficient documentation

## 2019-10-04 DIAGNOSIS — Z923 Personal history of irradiation: Secondary | ICD-10-CM | POA: Insufficient documentation

## 2019-10-31 DIAGNOSIS — K1321 Leukoplakia of oral mucosa, including tongue: Secondary | ICD-10-CM | POA: Insufficient documentation

## 2019-10-31 DIAGNOSIS — K117 Disturbances of salivary secretion: Secondary | ICD-10-CM | POA: Insufficient documentation

## 2019-10-31 DIAGNOSIS — C069 Malignant neoplasm of mouth, unspecified: Secondary | ICD-10-CM | POA: Insufficient documentation

## 2019-12-06 DIAGNOSIS — R220 Localized swelling, mass and lump, head: Secondary | ICD-10-CM | POA: Insufficient documentation

## 2019-12-08 DIAGNOSIS — Z85819 Personal history of malignant neoplasm of unspecified site of lip, oral cavity, and pharynx: Secondary | ICD-10-CM | POA: Insufficient documentation

## 2020-04-09 NOTE — Progress Notes (Signed)
[place]  Telephone:(336) [phone] Fax:(336) [phone]  ID: Lucas Escobar OB: 09-04-1970  MR#: [id number]  RKY#:[id number]  Patient Care Team: Patient, No Pcp Per as PCP - General (General Practice) Marygrace Drought, MD as Referring Physician (Family Medicine) Lucky Cowboy, Erskine Squibb, MD as Referring Physician (Vascular Surgery) Margaretha Sheffield, MD (Otolaryngology) Lloyd Huger, MD as Consulting Physician (Oncology) Noreene Filbert, MD as Referring Physician (Radiation Oncology)  I connected with Lucas Escobar on 04/12/20 at  2:45 PM EST by video enabled telemedicine visit and verified that I am speaking with the correct person using two identifiers.   I discussed the limitations, risks, security and privacy concerns of performing an evaluation and management service by telemedicine and the availability of in-person appointments. I also discussed with the patient that there may be a patient responsible charge related to this service. The patient expressed understanding and agreed to proceed.   Other persons participating in the visit and their role in the encounter: Patient, MD.  Patient's location: Home. Provider's location: Clinic.  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  INTERVAL HISTORY: Patient agreed to video assisted telemedicine visit for his routine 47-month evaluation.  He continues to feel well and remains asymptomatic.  He denies any pain or dysphagia.  He has no neurologic complaints.  He denies any recent fevers or illnesses.  He has a good appetite and denies weight loss.  He denies any chest pain, shortness of breath, cough, or hemoptysis.  He has no nausea, vomiting, constipation, or diarrhea.  He has no urinary complaints.  Patient offers no specific complaints today.    REVIEW OF SYSTEMS:   Review of Systems  Constitutional: Negative.  Negative for fever, malaise/fatigue and weight loss.  HENT: Negative for  sinus pain and sore throat.   Respiratory: Negative.  Negative for cough, hemoptysis and shortness of breath.   Cardiovascular: Negative.  Negative for chest pain and leg swelling.  Gastrointestinal: Negative.  Negative for abdominal pain.  Genitourinary: Negative.  Negative for dysuria.  Musculoskeletal: Negative.  Negative for back pain and neck pain.  Skin: Negative.  Negative for rash.  Neurological: Negative.  Negative for dizziness, speech change, focal weakness, weakness and headaches.  Psychiatric/Behavioral: Negative.  The patient is not nervous/anxious.     As per HPI. Otherwise, a complete review of systems is negative.  PAST MEDICAL HISTORY: Paranoid schizophrenia. Past Medical History:  Diagnosis Date  . Head and neck cancer ([place])     PAST SURGICAL HISTORY: Cholecystectomy.  FAMILY HISTORY: Family History  Problem Relation Age of Onset  . Colon cancer Father   . Colon cancer Paternal Grandmother     ADVANCED DIRECTIVES (Y/N):  N  HEALTH MAINTENANCE: Social History   Tobacco Use  . Smoking status: Former Smoker    Years: 25.00  . Smokeless tobacco: Former Systems developer    Types: Snuff, Chew  Substance Use Topics  . Alcohol use: Not Currently  . Drug use: Not Currently     Colonoscopy:  PAP:  Bone density:  Lipid panel:  Allergies  Allergen Reactions  . Iodine Swelling  . Shellfish Allergy Swelling  . Latex Rash    Current Outpatient Medications  Medication Sig Dispense Refill  . ARIPiprazole (ABILIFY) 20 MG tablet Take by mouth.    Marland Kitchen buPROPion (WELLBUTRIN XL) 150 MG 24 hr tablet Take by mouth.    . metFORMIN (GLUCOPHAGE) 500 MG tablet Take by mouth daily with breakfast.     .  simvastatin (ZOCOR) 40 MG tablet Take 40 mg by mouth at bedtime.    . SUMAtriptan (IMITREX) 50 MG tablet Take by mouth.    . traZODone (DESYREL) 100 MG tablet 100 mg at bedtime.     . Vilazodone HCl 20 MG TABS Take by mouth.     No current facility-administered medications for  this visit.    OBJECTIVE: There were no vitals filed for this visit.   There is no height or weight on file to calculate BMI.    ECOG FS:0 - Asymptomatic  General: Well-developed, well-nourished, no acute distress. HEENT: Normocephalic. Neuro: Alert, answering all questions appropriately. Cranial nerves grossly intact. Psych: Normal affect.   LAB RESULTS:  Lab Results  Component Value Date   NA 140 12/23/2018   K 4.3 12/23/2018   CL 105 12/23/2018   CO2 27 12/23/2018   GLUCOSE 126 (H) 12/23/2018   BUN 16 12/23/2018   CREATININE 1.11 12/23/2018   CALCIUM 9.8 12/23/2018   GFRNONAA >60 12/23/2018   GFRAA >60 12/23/2018    Lab Results  Component Value Date   WBC 4.9 12/23/2018   NEUTROABS 2.9 12/23/2018   HGB 13.0 12/23/2018   HCT 38.4 (L) 12/23/2018   MCV 97.0 12/23/2018   PLT 173 12/23/2018     STUDIES: No results found.  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  PLAN:    1.  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  PET scan results from March 15, 2019 reviewed independently with no obvious evidence of recurrent or progressive disease.  No further imaging is necessary unless there is suspicion of recurrence.  Patient reports he recently had evaluation by ENT in [place] who reported no evidence of disease.  No intervention is needed.  Return to clinic in 6 months with video assisted telemedicine visit.  At that point, can consider transitioning to yearly evaluation.    2.  Pain: Resolved. 3.  Paranoid schizophrenia: Continue current medications.  Monitor.  I provided 20 minutes of face-to-face video visit time during this encounter which included chart review, counseling, and coordination of care as documented above.   Patient expressed understanding and was in agreement with this plan. He also understands that  He can call clinic at any time with any questions, concerns, or complaints.   Cancer Staging Squamous cell carcinoma of head and neck ([place]) Staging form: Oral Cavity, AJCC 8th Edition - Clinical: Stage IVA (cT2, cN2, cM0) - Signed by Lloyd Huger, MD on 08/02/2018 Laterality: Left   Lloyd Huger, MD   04/12/2020 6:14 AM

## 2020-04-10 ENCOUNTER — Inpatient Hospital Stay: Payer: Medicare Other | Attending: Oncology | Admitting: Oncology

## 2020-04-10 ENCOUNTER — Encounter: Payer: Self-pay | Admitting: Oncology

## 2020-04-10 DIAGNOSIS — C760 Malignant neoplasm of head, face and neck: Secondary | ICD-10-CM

## 2020-04-10 NOTE — Progress Notes (Signed)
Pt called name and date of birth verified.  Pt reports has moved away from the area.  States is doing well.  Medications reviewed and updated as well as pharmacy.

## 2020-09-10 ENCOUNTER — Ambulatory Visit: Payer: Medicare Other | Admitting: Radiation Oncology

## 2020-10-13 NOTE — Progress Notes (Signed)
[place]  Telephone:(336) [phone] Fax:(336) [phone]  ID: Lucas Escobar OB: 05-15-1970  MR#: [id number]  [id number]  Patient Care Team: Patient, No Pcp Per (Inactive) as PCP - General (General Practice) Marygrace Drought, MD as Referring Physician (Family Medicine) Dew, Erskine Squibb, MD as Referring Physician (Vascular Surgery) Margaretha Sheffield, MD (Otolaryngology) Lloyd Huger, MD as Consulting Physician (Oncology) Noreene Filbert, MD as Referring Physician (Radiation Oncology)  I connected with Lucas Escobar on 10/17/20 at  2:45 PM EDT by video enabled telemedicine visit and verified that I am speaking with the correct person using two identifiers.   I discussed the limitations, risks, security and privacy concerns of performing an evaluation and management service by telemedicine and the availability of in-person appointments. I also discussed with the patient that there may be a patient responsible charge related to this service. The patient expressed understanding and agreed to proceed.   Other persons participating in the visit and their role in the encounter: Patient, MD.  Patient's location: Home. Provider's location: Clinic.  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  INTERVAL HISTORY: Patient agreed to video assisted telemedicine visit for routine 89-monthevaluation.  He continues to feel well and remains asymptomatic.  He continues to follow-up with ENT on a regular basis.  He denies any pain or dysphagia.  He has a good appetite and denies weight loss.  He has no neurologic complaints.  He denies any recent fevers or illnesses.  He denies any chest pain, shortness of breath, cough, or hemoptysis.  He has no nausea, vomiting, constipation, or diarrhea.  He has no urinary complaints.  Patient feels at his baseline offers no specific complaints today.  REVIEW OF SYSTEMS:   Review of Systems  Constitutional:  Negative.  Negative for fever, malaise/fatigue and weight loss.  HENT:  Negative for sinus pain and sore throat.   Respiratory: Negative.  Negative for cough, hemoptysis and shortness of breath.   Cardiovascular: Negative.  Negative for chest pain and leg swelling.  Gastrointestinal: Negative.  Negative for abdominal pain.  Genitourinary: Negative.  Negative for dysuria.  Musculoskeletal: Negative.  Negative for back pain and neck pain.  Skin: Negative.  Negative for rash.  Neurological: Negative.  Negative for dizziness, speech change, focal weakness, weakness and headaches.  Psychiatric/Behavioral: Negative.  The patient is not nervous/anxious.    As per HPI. Otherwise, a complete review of systems is negative.  PAST MEDICAL HISTORY: Paranoid schizophrenia. Past Medical History:  Diagnosis Date   Head and neck cancer ([place]     PAST SURGICAL HISTORY: Cholecystectomy.  FAMILY HISTORY: Family History  Problem Relation Age of Onset   Colon cancer Father    Colon cancer Paternal Grandmother     ADVANCED DIRECTIVES (Y/N):  N  HEALTH MAINTENANCE: Social History   Tobacco Use   Smoking status: Former    Years: 25.00    Types: Cigarettes   Smokeless tobacco: Former    Types: Snuff, Chew  Substance Use Topics   Alcohol use: Not Currently   Drug use: Not Currently     Colonoscopy:  PAP:  Bone density:  Lipid panel:  Allergies  Allergen Reactions   Iodine Swelling   Shellfish Allergy Swelling   Latex Rash    Current Outpatient Medications  Medication Sig Dispense Refill   ARIPiprazole (ABILIFY) 20 MG tablet Take 20 mg by mouth daily.     ARIPiprazole (ABILIFY) 5 MG tablet Take 5 mg by mouth  daily.     buPROPion (WELLBUTRIN XL) 150 MG 24 hr tablet Take by mouth.     metFORMIN (GLUCOPHAGE) 500 MG tablet Take by mouth 2 (two) times daily with a meal.     simvastatin (ZOCOR) 40 MG tablet Take 40 mg by mouth at bedtime.     SUMAtriptan (IMITREX) 50 MG tablet Take by  mouth.     traZODone (DESYREL) 100 MG tablet 100 mg at bedtime.      Vilazodone HCl 20 MG TABS Take by mouth.     valACYclovir (VALTREX) 1000 MG tablet Take 1,000 mg by mouth 2 (two) times daily. (Patient not taking: Reported on 10/16/2020)     No current facility-administered medications for this visit.    OBJECTIVE: There were no vitals filed for this visit.   There is no height or weight on file to calculate BMI.    ECOG FS:0 - Asymptomatic  General: Well-developed, well-nourished, no acute distress. HEENT: Normocephalic. Neuro: Alert, answering all questions appropriately. Cranial nerves grossly intact. Psych: Normal affect.   LAB RESULTS:  Lab Results  Component Value Date   NA 140 12/23/2018   K 4.3 12/23/2018   CL 105 12/23/2018   CO2 27 12/23/2018   GLUCOSE 126 (H) 12/23/2018   BUN 16 12/23/2018   CREATININE 1.11 12/23/2018   CALCIUM 9.8 12/23/2018   GFRNONAA >60 12/23/2018   GFRAA >60 12/23/2018    Lab Results  Component Value Date   WBC 4.9 12/23/2018   NEUTROABS 2.9 12/23/2018   HGB 13.0 12/23/2018   HCT 38.4 (L) 12/23/2018   MCV 97.0 12/23/2018   PLT 173 12/23/2018     STUDIES: No results found.  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  PLAN:    1.  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  PET scan results from March 15, 2019 reviewed independently with no obvious evidence of recurrent or progressive disease.  No further imaging is necessary unless there is suspicion of recurrence.  Patient continues to follow-up closely with ENT in [facility] and reports a normal endoscopy several weeks ago.  No intervention is needed at this time.  Patient can now be transitioned to yearly video visits. 2.  Pain: Resolved. 3.  Paranoid schizophrenia: Continue current medications.  Monitor.  I provided 20 minutes  of face-to-face video visit time during this encounter which included chart review, counseling, and coordination of care as documented above.   Patient expressed understanding and was in agreement with this plan. He also understands that He can call clinic at any time with any questions, concerns, or complaints.   Cancer Staging Squamous cell carcinoma of head and neck ([place]) Staging form: Oral Cavity, AJCC 8th Edition - Clinical: Stage IVA (cT2, cN2, cM0) - Signed by Lloyd Huger, MD on 08/02/2018 Laterality: Left   Lloyd Huger, MD   10/17/2020 7:54 AM

## 2020-10-16 ENCOUNTER — Inpatient Hospital Stay: Payer: Medicare Other | Attending: Oncology | Admitting: Oncology

## 2020-10-16 DIAGNOSIS — C760 Malignant neoplasm of head, face and neck: Secondary | ICD-10-CM

## 2020-10-16 NOTE — Progress Notes (Signed)
Patient states that he get occasional jaw cramps

## 2020-10-17 ENCOUNTER — Encounter: Payer: Self-pay | Admitting: Oncology

## 2021-01-09 IMAGING — CT CT NECK WITHOUT CONTRAST
2 of 3 series · 8 of 14 positions shown, 10 images · non-contrast
Comparison: None.

CLINICAL DATA: Oral mucosal lesion and enlarged left-sided lymph
node.

EXAM:
CT NECK WITHOUT CONTRAST
TECHNIQUE: Multidetector CT imaging of the neck was performed following the
standard protocol without intravenous contrast.

[Series 3: axial neck (person_name) · axial · 0.48mm/px · z∈[+278,+394]mm · 3 of 117 slices shown]
[im 30/117  bone]
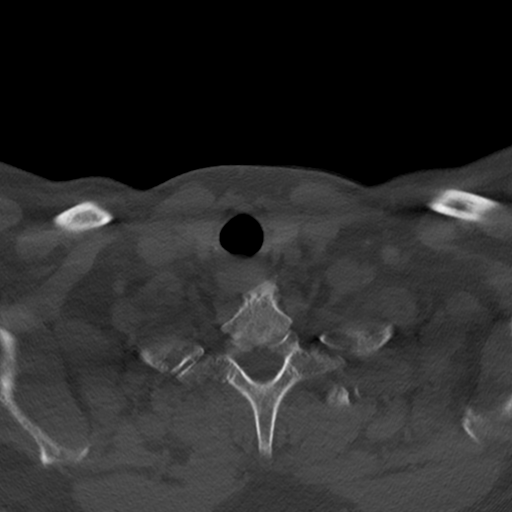
[im 59/117  bone]
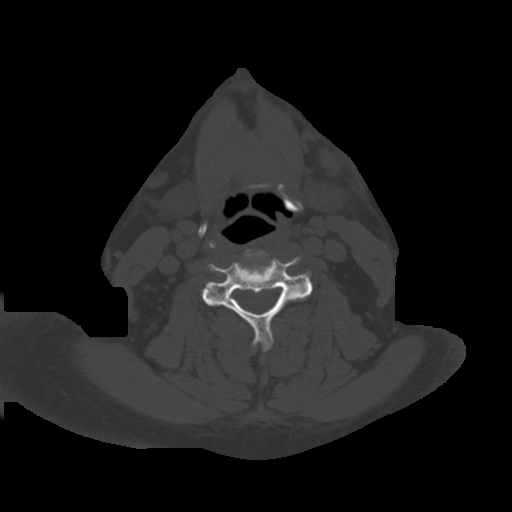
[im 88/117  bone]
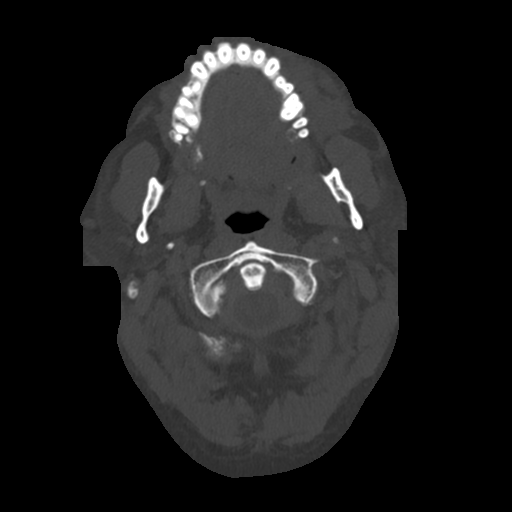

[Series 8: orthogonal ax · axial · 0.53mm/px · z∈[+189,+366]mm · 5 of 149 slices shown, 7 images]
[im 25/149  soft-tissue]
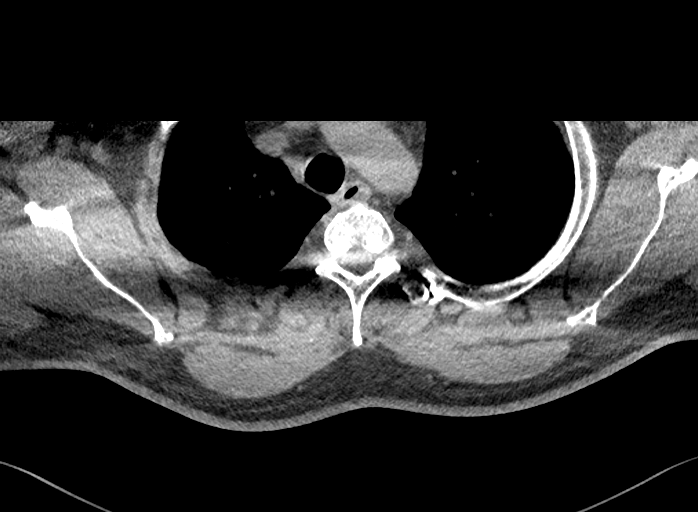
[im 25/149  bone]
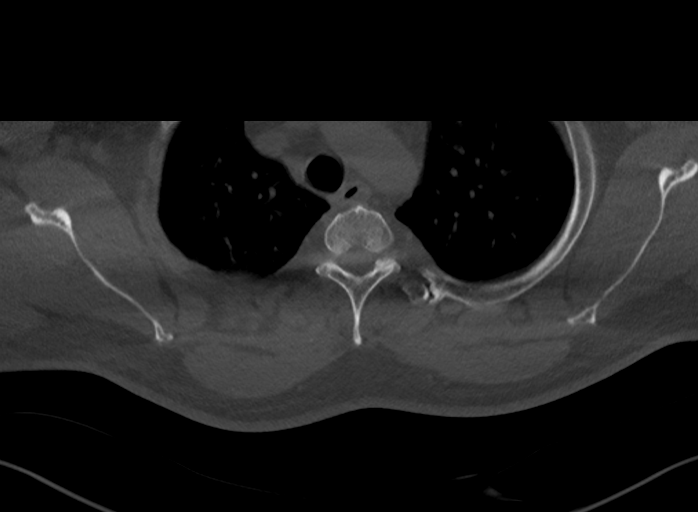
[im 50/149  bone]
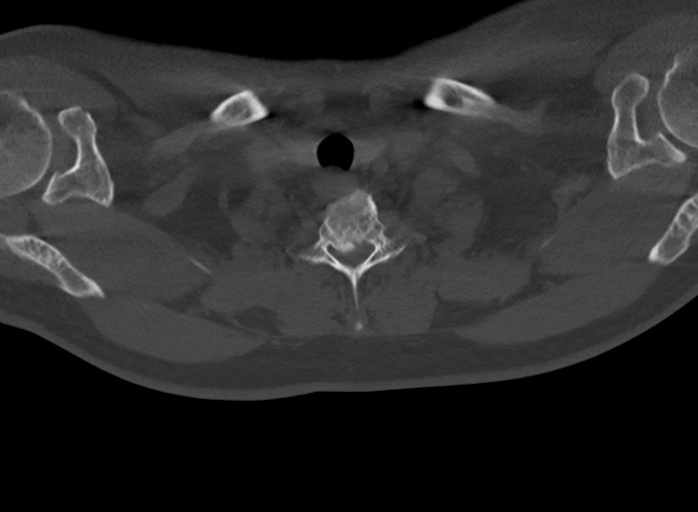
[im 75/149  bone]
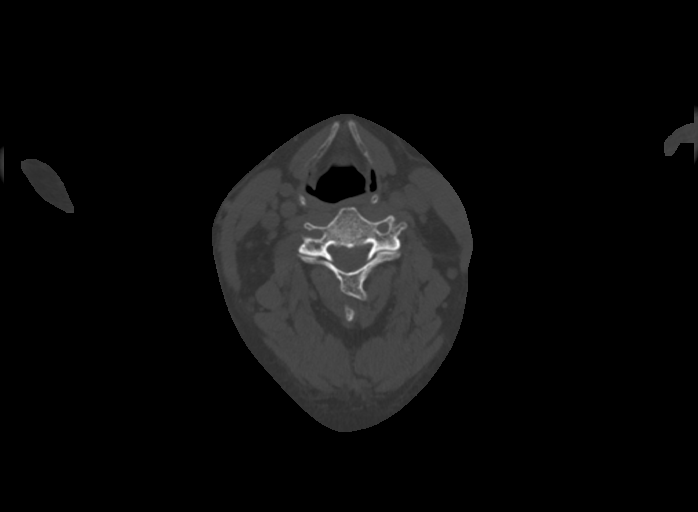
[im 99/149  bone]
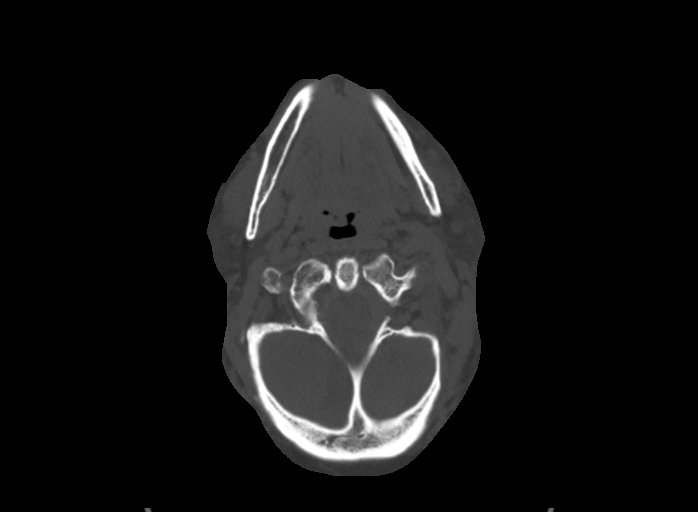
[im 124/149  soft-tissue]
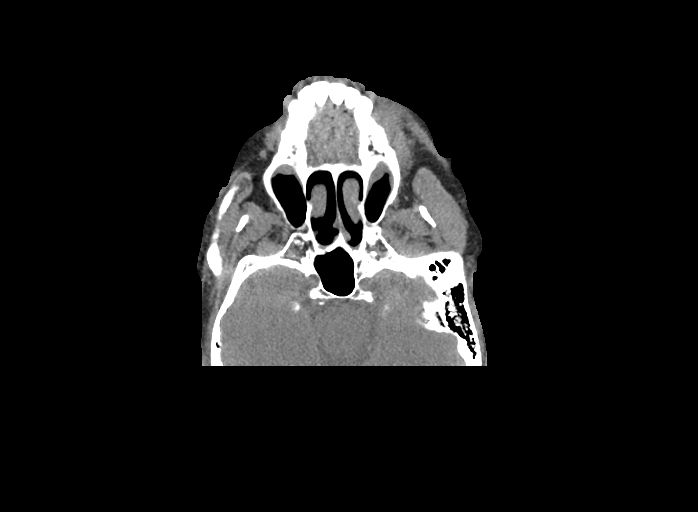
[im 124/149  bone]
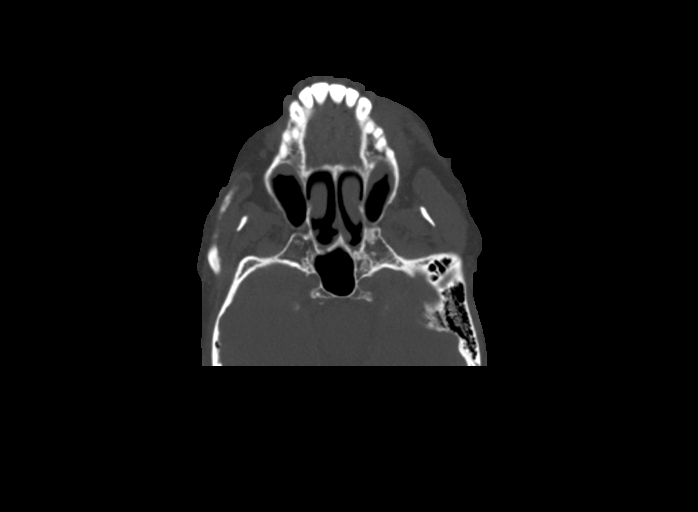

[8 of 14 positions shown; findings below may reference images not displayed]

FINDINGS: Pharynx and larynx: Assessment for mucosal lesions is limited by the
absence of IV contrast, however there is asymmetric masslike left
buccal space soft tissue measuring approximately 2 x 2 cm (series 3,
image 31). Patent airway. Unremarkable larynx.

Salivary glands: No inflammation, mass, or stone.

Thyroid: Unremarkable.

Lymph nodes: 3.0 x 2.2 cm left level IB lymph node with internal low
density anteriorly. Mildly enlarged lymph node slightly more
anteriorly in left level IB measuring 12 mm in short axis with a
prominent fatty hilum, not clearly pathologic. Mildly prominent
number of subcentimeter short axis lymph nodes in the neck
bilaterally which are largely symmetric between left and right
sides.

Vascular: Limited assessment in the absence of IV contrast.

Limited intracranial: Unremarkable.

Visualized orbits: Unremarkable.

Mastoids and visualized paranasal sinuses: Mild mucosal thickening
in the bilateral ethmoid, left sphenoid, and bilateral maxillary
sinuses. Clear mastoid air cells.

Skeleton: Cervicothoracic spondylosis. No suspicious osseous lesion.

Upper chest: Clear lung apices.

Other: None.
IMPRESSION: 2 cm left buccal space mass with 3 cm left level IB nodal
metastasis.

## 2021-02-12 ENCOUNTER — Encounter: Payer: Self-pay | Admitting: Oncology

## 2021-02-28 NOTE — Progress Notes (Signed)
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  INTERVAL HISTORY: Patient seen in clinic today after biopsy by her physician in [place] review of recurrent squamous cell carcinoma.  Repeat procedure with multiple biopsies taken shortly thereafter reported no evidence of malignancy no residual squamous cell carcinoma identified.  Patient reports a CT scan completed, but we do not have the results.  He otherwise feels well. He denies any pain or dysphagia.  He has a good appetite and denies weight loss.  He has no neurologic complaints.  He denies any recent fevers or illnesses.  He denies any chest pain, shortness of breath, cough, or hemoptysis.  He has no nausea, vomiting, constipation, or diarrhea.  He has no urinary complaints.  Patient offers no further specific complaints today.  REVIEW OF SYSTEMS:   Review of Systems  Constitutional: Negative.  Negative for fever, malaise/fatigue and weight loss.  HENT:  Negative for sinus pain and sore throat.   Respiratory: Negative.  Negative for cough, hemoptysis and shortness of breath.   Cardiovascular: Negative.  Negative for chest pain and leg swelling.  Gastrointestinal: Negative.  Negative for abdominal pain.  Genitourinary: Negative.  Negative for dysuria.  Musculoskeletal: Negative.  Negative for back pain and neck pain.  Skin: Negative.  Negative for rash.  Neurological: Negative.  Negative  for dizziness, speech change, focal weakness, weakness and headaches.  Psychiatric/Behavioral: Negative.  The patient is not nervous/anxious.    As per HPI. Otherwise, a complete review of systems is negative.  PAST MEDICAL HISTORY: Paranoid schizophrenia. Past Medical History:  Diagnosis Date   Head and neck cancer ([place])     PAST SURGICAL HISTORY: Cholecystectomy.  FAMILY HISTORY: Family History  Problem Relation Age of Onset   Colon cancer Father    Colon cancer Paternal Grandmother     ADVANCED DIRECTIVES (Y/N):  N  HEALTH MAINTENANCE: Social History   Tobacco Use   Smoking status: Former    Years: 25.00    Types: Cigarettes   Smokeless tobacco: Former    Types: Snuff, Chew  Substance Use Topics   Alcohol use: Not Currently   Drug use: Not Currently     Colonoscopy:  PAP:  Bone density:  Lipid panel:  Allergies  Allergen Reactions   Iodine Swelling   Shellfish Allergy Swelling   Latex Rash    Current Outpatient Medications  Medication Sig Dispense Refill   ARIPiprazole (ABILIFY) 20 MG tablet Take 20 mg by mouth daily.     fenofibrate 160 MG tablet SMARTSIG:1 Tablet(s) By Mouth Every Evening     fluticasone (FLONASE) 50 MCG/ACT nasal spray Place 2 sprays into both nostrils daily.     metFORMIN (GLUCOPHAGE) 500 MG tablet Take by mouth 2 (two) times daily with a meal.     simvastatin (ZOCOR) 40 MG tablet Take 40 mg by mouth at bedtime.     SUMAtriptan (IMITREX) 50 MG tablet Take by mouth.     traZODone (DESYREL) 100 MG tablet 100 mg at bedtime.      Vilazodone  HCl 20 MG TABS Take by mouth.     valACYclovir (VALTREX) 1000 MG tablet Take 1,000 mg by mouth 2 (two) times daily. (Patient not taking: Reported on 10/16/2020)     No current facility-administered medications for this visit.    OBJECTIVE: Vitals:   03/05/21 1022  BP: 140/83  Pulse: 85  Resp: 18  Temp: 98.1 F (36.7 C)  SpO2: 96%     Body mass index is 45.39 kg/m.    ECOG FS:0 -  Asymptomatic  General: Well-developed, well-nourished, no acute distress. Eyes: Pink conjunctiva, anicteric sclera. HEENT: Normocephalic, moist mucous membranes.  No oral lesions noted.  No palpable lymphadenopathy. Lungs: No audible wheezing or coughing. Heart: Regular rate and rhythm. Abdomen: Soft, nontender, no obvious distention. Musculoskeletal: No edema, cyanosis, or clubbing. Neuro: Alert, answering all questions appropriately. Cranial nerves grossly intact. Skin: No rashes or petechiae noted. Psych: Normal affect.   LAB RESULTS:  Lab Results  Component Value Date   NA 140 12/23/2018   K 4.3 12/23/2018   CL 105 12/23/2018   CO2 27 12/23/2018   GLUCOSE 126 (H) 12/23/2018   BUN 16 12/23/2018   CREATININE 1.11 12/23/2018   CALCIUM 9.8 12/23/2018   GFRNONAA >60 12/23/2018   GFRAA >60 12/23/2018    Lab Results  Component Value Date   WBC 4.9 12/23/2018   NEUTROABS 2.9 12/23/2018   HGB 13.0 12/23/2018   HCT 38.4 (L) 12/23/2018   MCV 97.0 12/23/2018   PLT 173 12/23/2018     STUDIES: No results found.  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  PLAN:    1.  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  PET scan results from March 15, 2019 reviewed independently with no obvious evidence of recurrent or progressive disease.  By report, patient had positive biopsy for recurrent squamous cell carcinoma in his left buccal mucosa, but we do not have these results.  Repeat procedure completed on February 15, 2021 did not reveal any residual squamous carcinoma including no tumor at initial biopsy/resection margins.  Patient reports a CT scan completed, but we do not have these results.  Patient has been instructed to return to his local ENT doctor, Dr. Charolett Bumpers, in the next 1 to 2 weeks.  Will also get a PET scan for further evaluation.   Patient had a video visit 1 to 2 days after his PET scan to discuss the results.   2.  Pain: Resolved. 3.  Paranoid schizophrenia: Continue current medications.  Monitor.  I spent a total of 30 minutes reviewing chart data, face-to-face evaluation with the patient, counseling and coordination of care as detailed above.    Patient expressed understanding and was in agreement with this plan. He also understands that He can call clinic at any time with any questions, concerns, or complaints.    Cancer Staging  Squamous cell carcinoma of head and neck ([place]) Staging form: Oral Cavity, AJCC 8th Edition - Clinical: Stage IVA (cT2, cN2, cM0) - Signed by Lloyd Huger, MD on 08/02/2018 Laterality: Left   Lloyd Huger, MD   03/05/2021 2:33 PM

## 2021-03-05 ENCOUNTER — Other Ambulatory Visit: Payer: Self-pay

## 2021-03-05 ENCOUNTER — Encounter: Payer: Self-pay | Admitting: Oncology

## 2021-03-05 ENCOUNTER — Inpatient Hospital Stay: Payer: Medicare Other | Attending: Oncology | Admitting: Oncology

## 2021-03-05 VITALS — BP 140/83 | HR 85 | Temp 98.1°F | Resp 18 | Ht 73.0 in | Wt 344.0 lb

## 2021-03-05 DIAGNOSIS — Z923 Personal history of irradiation: Secondary | ICD-10-CM | POA: Diagnosis not present

## 2021-03-05 DIAGNOSIS — C760 Malignant neoplasm of head, face and neck: Secondary | ICD-10-CM | POA: Insufficient documentation

## 2021-03-05 DIAGNOSIS — Z79899 Other long term (current) drug therapy: Secondary | ICD-10-CM | POA: Insufficient documentation

## 2021-03-05 DIAGNOSIS — Z9221 Personal history of antineoplastic chemotherapy: Secondary | ICD-10-CM | POA: Insufficient documentation

## 2021-03-05 DIAGNOSIS — F200 Paranoid schizophrenia: Secondary | ICD-10-CM | POA: Diagnosis not present

## 2021-03-14 ENCOUNTER — Encounter
Admission: RE | Admit: 2021-03-14 | Discharge: 2021-03-14 | Disposition: A | Discharge status: Home / Self Care | Payer: Medicare Other | Source: Ambulatory Visit | Attending: Oncology | Admitting: Oncology

## 2021-03-14 ENCOUNTER — Other Ambulatory Visit: Payer: Self-pay

## 2021-03-14 DIAGNOSIS — C760 Malignant neoplasm of head, face and neck: Secondary | ICD-10-CM | POA: Insufficient documentation

## 2021-03-14 DIAGNOSIS — K760 Fatty (change of) liver, not elsewhere classified: Secondary | ICD-10-CM | POA: Insufficient documentation

## 2021-03-14 LAB — GLUCOSE, CAPILLARY: Glucose-Capillary: 205 mg/dL — ABNORMAL HIGH (ref 70–99)

## 2021-03-14 MED ORDER — FLUDEOXYGLUCOSE F - 18 (FDG) INJECTION
16.0000 | Freq: Once | INTRAVENOUS | Status: AC | PRN
  Administered 2021-03-14: 16.9 via INTRAVENOUS

## 2021-03-18 NOTE — Progress Notes (Signed)
[place]  Telephone:(336) [phone] Fax:(336) [phone]  ID: Lucas Escobar [place] OB: 1971/01/08  MR#: [id number]  WUX#:[id number]  Patient Care Team: Default, Provider, MD as PCP - General Marygrace Drought, MD as Referring Physician (Family Medicine) Lucky Cowboy, Erskine Squibb, MD as Referring Physician (Vascular Surgery) Margaretha Sheffield, MD (Otolaryngology) Lloyd Huger, MD as Consulting Physician (Oncology) Noreene Filbert, MD as Referring Physician (Radiation Oncology)  I connected with Lucas Escobar on 03/19/21 at  3:30 PM EST by video enabled telemedicine visit and verified that I am speaking with the correct person using two identifiers.   I discussed the limitations, risks, security and privacy concerns of performing an evaluation and management service by telemedicine and the availability of in-person appointments. I also discussed with the patient that there may be a patient responsible charge related to this service. The patient expressed understanding and agreed to proceed.   Other persons participating in the visit and their role in the encounter: Patient, MD.  Patients location: Home. Providers location: Clinic.  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  INTERVAL HISTORY: Patient agreed to video assisted telemedicine visit for further evaluation and discussion of his PET scan results.  He currently feels well and is asymptomatic. He denies any pain or dysphagia.  He has a good appetite and denies weight loss.  He has no neurologic complaints.  He denies any recent fevers or illnesses.  He denies any chest pain, shortness of breath, cough, or hemoptysis.  He has no nausea, vomiting, constipation, or diarrhea.  He has no urinary complaints.  Patient offers no specific complaints today.  REVIEW OF SYSTEMS:   Review of Systems  Constitutional: Negative.  Negative for fever, malaise/fatigue and weight loss.  HENT:  Negative for  sinus pain and sore throat.   Respiratory: Negative.  Negative for cough, hemoptysis and shortness of breath.   Cardiovascular: Negative.  Negative for chest pain and leg swelling.  Gastrointestinal: Negative.  Negative for abdominal pain.  Genitourinary: Negative.  Negative for dysuria.  Musculoskeletal: Negative.  Negative for back pain and neck pain.  Skin: Negative.  Negative for rash.  Neurological: Negative.  Negative for dizziness, speech change, focal weakness, weakness and headaches.  Psychiatric/Behavioral: Negative.  The patient is not nervous/anxious.    As per HPI. Otherwise, a complete review of systems is negative.  PAST MEDICAL HISTORY: Paranoid schizophrenia. Past Medical History:  Diagnosis Date   Head and neck cancer ([place])     PAST SURGICAL HISTORY: Cholecystectomy.  FAMILY HISTORY: Family History  Problem Relation Age of Onset   Colon cancer Father    Colon cancer Paternal Grandmother     ADVANCED DIRECTIVES (Y/N):  N  HEALTH MAINTENANCE: Social History   Tobacco Use   Smoking status: Former    Years: 25.00    Types: Cigarettes   Smokeless tobacco: Former    Types: Snuff, Chew  Substance Use Topics   Alcohol use: Not Currently   Drug use: Not Currently     Colonoscopy:  PAP:  Bone density:  Lipid panel:  Allergies  Allergen Reactions   Iodine Swelling   Shellfish Allergy Swelling   Latex Rash    Current Outpatient Medications  Medication Sig Dispense Refill   ARIPiprazole (ABILIFY) 20 MG tablet Take 20 mg by mouth daily.     buPROPion (WELLBUTRIN XL) 150 MG 24 hr tablet Take by mouth.     fenofibrate 160 MG tablet SMARTSIG:1 Tablet(s) By Mouth Every Evening  fluticasone (FLONASE) 50 MCG/ACT nasal spray Place 2 sprays into both nostrils daily.     metFORMIN (GLUCOPHAGE) 500 MG tablet Take by mouth 2 (two) times daily with a meal.     simvastatin (ZOCOR) 40 MG tablet Take 40 mg by mouth at bedtime.     SUMAtriptan (IMITREX) 50 MG  tablet Take by mouth.     traZODone (DESYREL) 100 MG tablet 100 mg at bedtime.      valACYclovir (VALTREX) 1000 MG tablet Take 1,000 mg by mouth 2 (two) times daily.     Vilazodone HCl 20 MG TABS Take by mouth.     No current facility-administered medications for this visit.    OBJECTIVE: There were no vitals filed for this visit.    There is no height or weight on file to calculate BMI.    ECOG FS:0 - Asymptomatic  General: Well-developed, well-nourished, no acute distress. HEENT: Normocephalic. Neuro: Alert, answering all questions appropriately. Cranial nerves grossly intact. Psych: Normal affect.   LAB RESULTS:  Lab Results  Component Value Date   NA 140 12/23/2018   K 4.3 12/23/2018   CL 105 12/23/2018   CO2 27 12/23/2018   GLUCOSE 126 (H) 12/23/2018   BUN 16 12/23/2018   CREATININE 1.11 12/23/2018   CALCIUM 9.8 12/23/2018   GFRNONAA >60 12/23/2018   GFRAA >60 12/23/2018    Lab Results  Component Value Date   WBC 4.9 12/23/2018   NEUTROABS 2.9 12/23/2018   HGB 13.0 12/23/2018   HCT 38.4 (L) 12/23/2018   MCV 97.0 12/23/2018   PLT 173 12/23/2018     STUDIES: NM PET Image Initial (PI) Skull Base To Thigh  Result Date: 03/15/2021 CLINICAL DATA:  Subsequent treatment strategy for squamous cell carcinoma of the head and neck. EXAM: NUCLEAR MEDICINE PET SKULL BASE TO THIGH TECHNIQUE: 16.9 mCi F-18 FDG was injected intravenously. Full-ring PET imaging was performed from the skull base to thigh after the radiotracer. CT data was obtained and used for attenuation correction and anatomic localization. Fasting blood glucose: 205 mg/dl COMPARISON:  Multiple exams, including 03/15/2019 FINDINGS: Mediastinal blood pool activity: SUV max 3.1 Liver activity: SUV max NA NECK: No recurrent mass or abnormal hypermetabolic activity identified. Incidental CT findings: Stable atrophic appearance of the left parotid gland left submandibular gland. 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 CHEST: No significant abnormal hypermetabolic activity in this region. Incidental CT findings: Stable mild lingular scarring. ABDOMEN/PELVIS: No significant abnormal hypermetabolic activity in this region. Incidental note is made of accentuated activity in the right antecubital fossa, thought to be injection site related. Incidental CT findings: Diffuse hepatic steatosis.  Cholecystectomy. SKELETON: No significant abnormal hypermetabolic activity in this region. Incidental CT findings: none IMPRESSION: 1. No findings of recurrent malignancy. 2. Diffuse hepatic steatosis. Electronically Signed   By: Van Clines M.D.   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  PLAN:    1.  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  PET scan results from March 15, 2019 reviewed independently with no obvious evidence of recurrent or progressive disease.  By report, patient had positive biopsy for recurrent squamous cell carcinoma in his left buccal mucosa, but we do not have these results.  Repeat procedure completed on February 15, 2021 did not reveal any residual squamous carcinoma including no tumor at initial biopsy/resection  margins.  Patient reports a CT scan completed, but we do not have these results.  PET scan results from March 15, 2021 reviewed independently and report as above with no hypermetabolic lesions to suggest recurrence.  Patient will need an appointment with his local ENT doctor, Dr. Charolett Bumpers, in the next 1 to 2 weeks for endoscopy and further evaluation.  Patient will not require any chemotherapy, but it is unclear if additional XRT may offer benefit.  Follow-up scheduled for 3 months, but patient may return sooner based on ENT evaluation.   2.  Pain: Resolved. 3.  Paranoid schizophrenia: Continue current medications.   Monitor.  I provided 20 minutes of face-to-face video visit time during this encounter which included chart review, counseling, and coordination of care as documented above.    Patient expressed understanding and was in agreement with this plan. He also understands that He can call clinic at any time with any questions, concerns, or complaints.    Cancer Staging  Squamous cell carcinoma of head and neck ([place]) Staging form: Oral Cavity, AJCC 8th Edition - Clinical: Stage IVA (cT2, cN2, cM0) - Signed by Lloyd Huger, MD on 08/02/2018 Laterality: Left   Lloyd Huger, MD   03/19/2021 4:21 PM

## 2021-03-19 ENCOUNTER — Inpatient Hospital Stay (HOSPITAL_BASED_OUTPATIENT_CLINIC_OR_DEPARTMENT_OTHER): Payer: Medicare Other | Admitting: Oncology

## 2021-03-19 ENCOUNTER — Encounter: Payer: Self-pay | Admitting: Oncology

## 2021-03-19 ENCOUNTER — Other Ambulatory Visit: Payer: Self-pay

## 2021-03-19 DIAGNOSIS — C760 Malignant neoplasm of head, face and neck: Secondary | ICD-10-CM

## 2021-05-06 ENCOUNTER — Ambulatory Visit: Payer: Medicare Other | Admitting: Family Medicine

## 2021-05-28 DIAGNOSIS — E1169 Type 2 diabetes mellitus with other specified complication: Secondary | ICD-10-CM | POA: Insufficient documentation

## 2021-05-28 DIAGNOSIS — G4700 Insomnia, unspecified: Secondary | ICD-10-CM | POA: Insufficient documentation

## 2021-05-28 DIAGNOSIS — Z79899 Other long term (current) drug therapy: Secondary | ICD-10-CM | POA: Insufficient documentation

## 2021-05-28 DIAGNOSIS — E669 Obesity, unspecified: Secondary | ICD-10-CM | POA: Insufficient documentation

## 2021-06-11 IMAGING — PT NM PET TUM IMG RESTAG (PS) SKULL BASE T - THIGH
1 of 10 series · 1 of 25 positions shown · non-contrast
Comparison: 08/09/2018

CLINICAL DATA: Subsequent treatment strategy for squamous cell
carcinoma of the head neck. Last chemotherapy and radiation therapy
2 months ago.

EXAM:
NUCLEAR MEDICINE PET SKULL BASE TO THIGH
TECHNIQUE: 16.5 mCi F-18 FDG was injected intravenously. Full-ring PET imaging
was performed from the skull base to thigh after the radiotracer. CT
data was obtained and used for attenuation correction and anatomic
localization.
Fasting blood glucose: 127 mg/dl

[Series 3: ct wb 5.0 b30f · axial · 5.0mm · 0.98mm/px · 1 of 368 slices shown]
[im 368/368  brain]
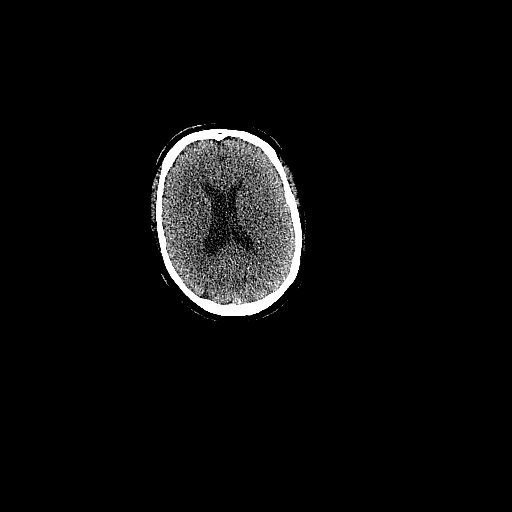

[1 of 25 positions shown; findings below may reference images not displayed]

FINDINGS: Mild degradation secondary to patient body habitus.

Mediastinal blood pool activity: SUV max

Liver activity: SUV max NA

NECK: The previously described left buccal space hypermetabolic mass
has resolved or been resected.

The enlarged left level 1B node is significantly improved. Measures
on the order of 1.4 x 0.8 cm and a S.U.V. max of 2.6 today. Compare
3.2 x 2.4 cm and a S.U.V. max of 10.6 on the prior.

No new cervical nodal hypermetabolism.

Incidental CT findings: No cervical adenopathy. Mucosal thickening
left maxillary sinus.

CHEST: No pulmonary parenchymal or thoracic nodal hypermetabolism.

Incidental CT findings: Right Port-A-Cath tip at superior
caval/atrial junction. Mild cardiomegaly.

ABDOMEN/PELVIS: No abdominopelvic parenchymal or nodal
hypermetabolism.

Incidental CT findings: Normal adrenal glands. Cholecystectomy.
Presumed dystrophic calcification within the midline of the penis.

SKELETON: No abnormal marrow activity.

Incidental CT findings: None
IMPRESSION: 1. Response to therapy of head neck primary. Resolution of left
buccal space mass and decrease in size and hypermetabolism of
ipsilateral level 1B node.
2. No evidence of extracervical metastatic disease.

## 2021-06-17 NOTE — Progress Notes (Deleted)
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  INTERVAL HISTORY: Patient agreed to video assisted telemedicine visit for further evaluation and discussion of his PET scan results.  He currently feels well and is asymptomatic. He denies any pain or dysphagia.  He has a good appetite and denies weight loss.  He has no neurologic complaints.  He denies any recent fevers or illnesses.  He denies any chest pain, shortness of breath, cough, or hemoptysis.  He has no nausea, vomiting, constipation, or diarrhea.  He has no urinary complaints.  Patient offers no specific complaints today.  REVIEW OF SYSTEMS:   Review of Systems  Constitutional: Negative.  Negative for fever, malaise/fatigue and weight loss.  HENT:  Negative for sinus pain and sore throat.   Respiratory: Negative.  Negative for cough, hemoptysis and shortness of breath.   Cardiovascular: Negative.  Negative for chest pain and leg swelling.  Gastrointestinal: Negative.  Negative for abdominal pain.  Genitourinary: Negative.  Negative for dysuria.  Musculoskeletal: Negative.  Negative for back pain and neck pain.  Skin: Negative.  Negative for rash.  Neurological: Negative.  Negative for dizziness, speech change, focal weakness, weakness and headaches.  Psychiatric/Behavioral: Negative.  The patient is not nervous/anxious.    As per HPI. Otherwise, a complete review of systems is negative.  PAST MEDICAL HISTORY: Paranoid  schizophrenia. Past Medical History:  Diagnosis Date   Head and neck cancer ([place])     PAST SURGICAL HISTORY: Cholecystectomy.  FAMILY HISTORY: Family History  Problem Relation Age of Onset   Colon cancer Father    Colon cancer Paternal Grandmother     ADVANCED DIRECTIVES (Y/N):  N  HEALTH MAINTENANCE: Social History   Tobacco Use   Smoking status: Former    Years: 25.00    Types: Cigarettes   Smokeless tobacco: Former    Types: Snuff, Chew  Substance Use Topics   Alcohol use: Not Currently   Drug use: Not Currently     Colonoscopy:  PAP:  Bone density:  Lipid panel:  Allergies  Allergen Reactions   Iodine Swelling   Shellfish Allergy Swelling   Latex Rash    Current Outpatient Medications  Medication Sig Dispense Refill   ARIPiprazole (ABILIFY) 20 MG tablet Take 20 mg by mouth daily.     fenofibrate 160 MG tablet SMARTSIG:1 Tablet(s) By Mouth Every Evening     fluticasone (FLONASE) 50 MCG/ACT nasal spray Place 2 sprays into both nostrils daily.     metFORMIN (GLUCOPHAGE) 500 MG tablet Take by mouth 2 (two) times daily with a meal.     simvastatin (ZOCOR) 40 MG tablet Take 40 mg by mouth at bedtime.     SUMAtriptan (IMITREX) 50 MG tablet Take by mouth.     traZODone (DESYREL) 100 MG tablet 100 mg at bedtime.      valACYclovir (VALTREX) 1000 MG tablet Take 1,000 mg by mouth 2 (two) times daily.     Vilazodone HCl 20 MG TABS Take by mouth.     No current facility-administered medications for this visit.  OBJECTIVE: There were no vitals filed for this visit.    There is no height or weight on file to calculate BMI.    ECOG FS:0 - Asymptomatic  General: Well-developed, well-nourished, no acute distress. HEENT: Normocephalic. Neuro: Alert, answering all questions appropriately. Cranial nerves grossly intact. Psych: Normal affect.   LAB RESULTS:  Lab Results  Component Value Date   NA 140 12/23/2018   K 4.3 12/23/2018   CL 105 12/23/2018   CO2 27  12/23/2018   GLUCOSE 126 (H) 12/23/2018   BUN 16 12/23/2018   CREATININE 1.11 12/23/2018   CALCIUM 9.8 12/23/2018   GFRNONAA >60 12/23/2018   GFRAA >60 12/23/2018    Lab Results  Component Value Date   WBC 4.9 12/23/2018   NEUTROABS 2.9 12/23/2018   HGB 13.0 12/23/2018   HCT 38.4 (L) 12/23/2018   MCV 97.0 12/23/2018   PLT 173 12/23/2018     STUDIES: No results found.  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  PLAN:    1.  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  PET scan results from March 15, 2019 reviewed independently with no obvious evidence of recurrent or progressive disease.  By report, patient had positive biopsy for recurrent squamous cell carcinoma in his left buccal mucosa, but we do not have these results.  Repeat procedure completed on February 15, 2021 did not reveal any residual squamous carcinoma including no tumor at initial biopsy/resection margins.  Patient reports a CT scan completed, but we do not have these results.  PET scan results from March 15, 2021 reviewed independently and report as above with no hypermetabolic lesions to suggest recurrence.  Patient will need an appointment with his local ENT doctor, Dr. Charolett Bumpers, in the next 1 to 2 weeks for endoscopy and further evaluation.  Patient will not require any chemotherapy, but it is unclear if additional XRT may offer benefit.  Follow-up scheduled for 3 months, but patient may return sooner based on ENT evaluation.   2.  Pain: Resolved. 3.  Paranoid schizophrenia: Continue current medications.  Monitor.   Patient expressed understanding and was in agreement with this plan. He also understands that He can call clinic at any time with any questions, concerns, or complaints.    Cancer Staging  Squamous cell carcinoma of head and neck ([place]) Staging form: Oral Cavity,  AJCC 8th Edition - Clinical: Stage IVA (cT2, cN2, cM0) - Signed by Lloyd Huger, MD on 08/02/2018 Laterality: Left   Lloyd Huger, MD   06/17/2021 2:11 PM

## 2021-06-18 ENCOUNTER — Inpatient Hospital Stay: Payer: Medicare Other | Admitting: Oncology

## 2021-06-18 ENCOUNTER — Encounter: Payer: Self-pay | Admitting: Oncology

## 2021-06-18 DIAGNOSIS — C760 Malignant neoplasm of head, face and neck: Secondary | ICD-10-CM

## 2021-06-21 NOTE — Progress Notes (Signed)
?[place]  ?[phone]) [phone] [phone] ? ?ID: Lucas Escobar OB: 02/26/1971  MR#: [id number]  LOV#:[id number] ? ?Patient Care Team: ?Default, Provider, MD as PCP - General ?Marygrace Drought, MD as Referring Physician (Family Medicine) ?Algernon Huxley, MD as Referring Physician (Vascular Surgery) ?Margaretha Sheffield, MD (Otolaryngology) ?Lloyd Huger, MD as Consulting Physician (Oncology) ?Noreene Filbert, MD as Referring Physician (Radiation Oncology) ? 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 ? ?INTERVAL HISTORY: Patient returns to clinic today for routine 45-monthevaluation.  He currently feels well and is asymptomatic.  He denies any pain or dysphagia. He has a good appetite and denies weight loss.  He has no neurologic complaints.  He denies any recent fevers or illnesses.  He denies any chest pain, shortness of breath, cough, or hemoptysis.  He has no nausea, vomiting, constipation, or diarrhea.  He has no urinary complaints.  Patient offers no specific complaints today. ? ?REVIEW OF SYSTEMS:   ?Review of Systems  ?Constitutional: Negative.  Negative for fever, malaise/fatigue and weight loss.  ?HENT:  Negative for sinus pain and sore throat.   ?Respiratory: Negative.  Negative for cough, hemoptysis and shortness of breath.   ?Cardiovascular: Negative.  Negative for chest pain and leg swelling.  ?Gastrointestinal: Negative.  Negative for abdominal pain.  ?Genitourinary: Negative.  Negative for dysuria.  ?Musculoskeletal: Negative.  Negative for back pain and neck pain.  ?Skin: Negative.  Negative for rash.  ?Neurological: Negative.  Negative for dizziness, speech change, focal weakness, weakness and headaches.  ?Psychiatric/Behavioral: Negative.  The patient is not nervous/anxious.   ? ?As per HPI. Otherwise, a complete review of systems is negative. ? ?PAST MEDICAL HISTORY: Paranoid schizophrenia. ?Past Medical History:  ?Diagnosis Date   ? [place] cancer ([place]   ? ? ?PAST SURGICAL HISTORY: Cholecystectomy. ? ?FAMILY HISTORY: ?Family History  ?Problem Relation Age of Onset  ? Colon cancer Father   ? Colon cancer Paternal Grandmother   ? ? ?ADVANCED DIRECTIVES (Y/N):  N ? ?HEALTH MAINTENANCE: ?Social History  ? ?Tobacco Use  ? Smoking status: Former  ?  Years: 25.00  ?  Types: Cigarettes  ? Smokeless tobacco: Former  ?  Types: Snuff, Chew  ?Substance Use Topics  ? Alcohol use: Not Currently  ? Drug use: Not Currently  ? ? ? Colonoscopy: ? PAP: ? Bone density: ? Lipid panel: ? ?Allergies  ?Allergen Reactions  ? Iodine Swelling  ? Shellfish Allergy Swelling  ? Latex Rash  ? ? ?Current Outpatient Medications  ?Medication Sig Dispense Refill  ? ARIPiprazole (ABILIFY) 20 MG tablet Take 20 mg by mouth daily.    ? buPROPion (WELLBUTRIN XL) 150 MG 24 hr tablet bupropion HCl XL 150 mg 24 hr tablet, extended release    ? fenofibrate 160 MG tablet SMARTSIG:1 Tablet(s) By Mouth Every Evening    ? fluticasone (FLONASE) 50 MCG/ACT nasal spray Place 2 sprays into both nostrils daily.    ? metFORMIN (GLUCOPHAGE) 500 MG tablet Take by mouth 2 (two) times daily with a meal.    ? simvastatin (ZOCOR) 40 MG tablet Take 40 mg by mouth at bedtime.    ? SUMAtriptan (IMITREX) 50 MG tablet Take by mouth.    ? traZODone (DESYREL) 100 MG tablet 100 mg at bedtime.     ? valACYclovir (VALTREX) 1000 MG tablet Take 1,000 mg by mouth 2 (two) times daily. (Patient not taking: Reported on 06/25/2021)    ? Vilazodone HCl  20 MG TABS Take by mouth.    ? ?No current facility-administered medications for this visit.  ? ? ?OBJECTIVE: ?Vitals:  ? 06/25/21 1018  ?BP: 118/79  ?Pulse: 78  ?Resp: 18  ?Temp: 97.9 ?F (36.6 ?C)  ?SpO2: 98%  ? ?   Body mass index is 42.48 kg/m?Marland Kitchen    ECOG FS:0 - Asymptomatic ? ?General: Well-developed, well-nourished, no acute distress. ?Eyes: Pink conjunctiva, anicteric sclera. ?HEENT: Normocephalic, moist mucous membranes.  No palpable lymphadenopathy. ?Lungs:  No audible wheezing or coughing. ?Heart: Regular rate and rhythm. ?Abdomen: Soft, nontender, no obvious distention. ?Musculoskeletal: No edema, cyanosis, or clubbing. ?Neuro: Alert, answering all questions appropriately. Cranial nerves grossly intact. ?Skin: No rashes or petechiae noted. ?Psych: Normal affect. ? ? ? ?LAB RESULTS: ? ?Lab Results  ?Component Value Date  ? NA 140 12/23/2018  ? K 4.3 12/23/2018  ? CL 105 12/23/2018  ? CO2 27 12/23/2018  ? GLUCOSE 126 (H) 12/23/2018  ? BUN 16 12/23/2018  ? CREATININE 1.11 12/23/2018  ? CALCIUM 9.8 12/23/2018  ? GFRNONAA >60 12/23/2018  ? GFRAA >60 12/23/2018  ? ? ?Lab Results  ?Component Value Date  ? WBC 4.9 12/23/2018  ? NEUTROABS 2.9 12/23/2018  ? HGB 13.0 12/23/2018  ? HCT 38.4 (L) 12/23/2018  ? MCV 97.0 12/23/2018  ? PLT 173 12/23/2018  ? ? ? ?STUDIES: ?No results found. ? 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 ? ?PLAN:   ? ?1.  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  PET scan results from March 15, 2019 reviewed independently with no obvious evidence of recurrent or progressive disease.  By report, patient had positive biopsy for recurrent squamous cell carcinoma in his left buccal mucosa, but we do not have these results.  Repeat procedure completed on February 15, 2021 did not reveal any residual squamous carcinoma including no tumor at initial biopsy/resection margins.  PET scan results from March 15, 2021 reviewed independently with no hypermetabolic lesions to suggest recurrence.  Patient was recently evaluated by ENT and reports a negative endoscopy.  Continue follow-up with Dr. Charolett Bumpers every 3 months as scheduled.  Return to clinic in 6 months for routine evaluation.   ?2.  Pain: Resolved. ?3.  Paranoid schizophrenia: Continue current medications.  Monitor. ? ?I spent a total of 20 minutes reviewing chart data,  face-to-face evaluation with the patient, counseling and coordination of care as detailed above. ? ? ? ?Patient expressed understanding and was in agreement with this plan. He also understands that He can call clinic at any time with any questions, concerns, or complaints.  ? ? Cancer Staging  ?Squamous cell carcinoma of head and neck ([place]) ?Staging form: Oral Cavity, AJCC 8th Edition ?- Clinical: Stage IVA (cT2, cN2, cM0) - Signed by Lloyd Huger, MD on 08/02/2018 ?Laterality: Left ? ? ?Lloyd Huger, MD   06/25/2021 10:45 AM ? ? ? ? ?

## 2021-06-25 ENCOUNTER — Inpatient Hospital Stay: Payer: Medicare Other | Attending: Oncology | Admitting: Oncology

## 2021-06-25 VITALS — BP 118/79 | HR 78 | Temp 97.9°F | Resp 18 | Ht 73.0 in | Wt 322.0 lb

## 2021-06-25 DIAGNOSIS — F200 Paranoid schizophrenia: Secondary | ICD-10-CM | POA: Insufficient documentation

## 2021-06-25 DIAGNOSIS — C760 Malignant neoplasm of head, face and neck: Secondary | ICD-10-CM | POA: Diagnosis present

## 2021-06-25 DIAGNOSIS — Z79899 Other long term (current) drug therapy: Secondary | ICD-10-CM | POA: Diagnosis not present

## 2021-08-13 DIAGNOSIS — Z8639 Personal history of other endocrine, nutritional and metabolic disease: Secondary | ICD-10-CM | POA: Insufficient documentation

## 2021-08-13 DIAGNOSIS — Z794 Long term (current) use of insulin: Secondary | ICD-10-CM | POA: Insufficient documentation

## 2021-08-13 DIAGNOSIS — F32A Depression, unspecified: Secondary | ICD-10-CM | POA: Insufficient documentation

## 2021-08-13 DIAGNOSIS — T50905S Adverse effect of unspecified drugs, medicaments and biological substances, sequela: Secondary | ICD-10-CM | POA: Insufficient documentation

## 2021-10-14 ENCOUNTER — Inpatient Hospital Stay: Payer: Medicare (Managed Care) | Attending: Oncology | Admitting: Nurse Practitioner

## 2021-10-14 DIAGNOSIS — C760 Malignant neoplasm of head, face and neck: Secondary | ICD-10-CM

## 2021-10-14 NOTE — Progress Notes (Signed)
Contacted patient for [facility] check-in. Reports new oral lesion. Sates that he is following ENT.

## 2021-10-14 NOTE — Progress Notes (Signed)
[place]  Telephone:([phone] Fax:(336) [phone]  Virtual Visit Progress Note  I connected with Lucas Escobar on 10/14/21 at  1:45 PM EDT by video enabled telemedicine visit and verified that I am speaking with the correct person using two identifiers.   I discussed the limitations, risks, security and privacy concerns of performing an evaluation and management service by telemedicine and the availability of in-person appointments. I also discussed with the patient that there may be a patient responsible charge related to this service. The patient expressed understanding and agreed to proceed.   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  INTERVAL HISTORY: Patient agrees to telemedicine for continued surveillance of head and neck cancer. He currently feels well and is asymptomatic.  He denies any pain or dysphagia. He has a good appetite and denies weight loss.  He has no neurologic complaints.  He denies any recent fevers or illnesses.  He denies any chest pain, shortness of breath, cough, or hemoptysis.  He has no nausea, vomiting, constipation, or diarrhea.  He has no urinary complaints.  Patient offers no specific complaints today.  REVIEW OF SYSTEMS:   Review of Systems  Constitutional: Negative.  Negative for fever, malaise/fatigue and weight loss.  HENT:  Negative for sinus pain  and sore throat.   Respiratory: Negative.  Negative for cough, hemoptysis and shortness of breath.   Cardiovascular: Negative.  Negative for chest pain and leg swelling.  Gastrointestinal: Negative.  Negative for abdominal pain.  Genitourinary: Negative.  Negative for dysuria.  Musculoskeletal: Negative.  Negative for back pain and neck pain.  Skin: Negative.  Negative for rash.  Neurological: Negative.  Negative for dizziness, speech change, focal weakness, weakness and headaches.  Psychiatric/Behavioral: Negative.  The patient is not nervous/anxious.   As per HPI. Otherwise, a complete review of systems is negative.  PAST MEDICAL HISTORY: Paranoid schizophrenia. Past Medical History:  Diagnosis Date   Head and neck cancer ([place])     PAST SURGICAL HISTORY: Cholecystectomy.  FAMILY HISTORY: Family History  Problem Relation Age of Onset   Colon cancer Father    Colon cancer Paternal Grandmother     ADVANCED DIRECTIVES (Y/N):  N  HEALTH MAINTENANCE: Social History   Tobacco Use   Smoking status: Former    Years: 25.00    Types: Cigarettes   Smokeless tobacco: Former    Types: Snuff, Chew  Substance Use Topics   Alcohol use: Not Currently   Drug use: Not Currently    Colonoscopy:  PAP:  Bone density:  Lipid panel:  Allergies  Allergen Reactions   Iodine Swelling   Shellfish Allergy Swelling   Latex Rash    Current Outpatient Medications  Medication Sig Dispense Refill   ARIPiprazole (ABILIFY) 20 MG tablet Take 20 mg by mouth daily.     buPROPion (WELLBUTRIN XL) 150 MG 24 hr tablet bupropion HCl XL 150 mg 24 hr tablet, extended release     fenofibrate 160 MG tablet  SMARTSIG:1 Tablet(s) By Mouth Every Evening     fluticasone (FLONASE) 50 MCG/ACT nasal spray Place 2 sprays into both nostrils daily.     Insulin Glargine (BASAGLAR KWIKPEN [place]) Inject 12 Units into the skin daily.     metFORMIN (GLUCOPHAGE) 500 MG tablet Take by mouth 2 (two) times daily with a meal.      simvastatin (ZOCOR) 40 MG tablet Take 40 mg by mouth at bedtime.     SUMAtriptan (IMITREX) 50 MG tablet Take by mouth.     traZODone (DESYREL) 100 MG tablet 100 mg at bedtime.      Vilazodone HCl 20 MG TABS Take by mouth.     valACYclovir (VALTREX) 1000 MG tablet Take 1,000 mg by mouth 2 (two) times daily. (Patient not taking: Reported on 06/25/2021)     No current facility-administered medications for this visit.    OBJECTIVE: There were no vitals filed for this visit.  There is no height or weight on file to calculate BMI.      ECOG FS:0 - Asymptomatic  General: Well-developed, well-nourished, no acute distress. Lungs: No audible wheezing or coughing Neuro: Alert, answering all questions appropriately.  Psych: Normal affect.  LAB RESULTS:  Lab Results  Component Value Date   NA 140 12/23/2018   K 4.3 12/23/2018   CL 105 12/23/2018   CO2 27 12/23/2018   GLUCOSE 126 (H) 12/23/2018   BUN 16 12/23/2018   CREATININE 1.11 12/23/2018   CALCIUM 9.8 12/23/2018   GFRNONAA >60 12/23/2018   GFRAA >60 12/23/2018    Lab Results  Component Value Date   WBC 4.9 12/23/2018   NEUTROABS 2.9 12/23/2018   HGB 13.0 12/23/2018   HCT 38.4 (L) 12/23/2018   MCV 97.0 12/23/2018   PLT 173 12/23/2018     STUDIES: No results found.  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  PLAN:    1.  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  PET scan results from March 15, 2019 reviewed independently with no obvious evidence of recurrent or progressive disease.  By report, patient had positive biopsy for recurrent squamous cell carcinoma in his left buccal mucosa, but we do not have these results.  Repeat procedure completed on February 15, 2021 did not reveal any residual squamous carcinoma including no tumor at initial biopsy/resection margins.  PET scan  results from March 15, 2021 reviewed independently with no hypermetabolic lesions to suggest recurrence.  Patient was recently evaluated by ENT and reports a negative endoscopy. Clinically, he remains asymptomatic. Continue follow-up with Dr. Charolett Bumpers every 3 months as scheduled.  Return to clinic in 6 months for routine evaluation.    2.  Pain: Resolved.  3.  Paranoid schizophrenia: Continue current medications.  Monitor.  4. Diabetes- now on insulin.   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 Will get scheduled for port flush  Disposition:  Port flush asap 2 mo- port flush 4 mo- port flush 6 mo- port flush & finnegan- la  I discussed the assessment and treatment plan with the patient. The patient was provided an opportunity to ask questions and all were answered. The patient agreed with the plan and demonstrated an understanding of the instructions.   The patient was advised to call back or seek an in-person evaluation if the symptoms worsen or if the condition fails to improve as anticipated.   I spent 20  minutes face-to-face video visit time dedicated to the care of this patient on the date of this encounter to include pre-visit review of <specify which records>, face-to-face time with the patient, and post visit ordering of testing/documentation.   Patient expressed understanding and was in agreement with this plan. He also understands that He can call clinic at any time with any questions, concerns, or complaints.    Cancer Staging  Squamous cell carcinoma of head and neck ([place]) Staging form: Oral Cavity, AJCC 8th Edition - Clinical: Stage IVA (cT2, cN2, cM0) - Signed by Lloyd Huger, MD on 08/02/2018 Laterality: Left   Verlon Au, NP   10/14/2021

## 2021-10-15 ENCOUNTER — Telehealth: Payer: Medicare Other | Admitting: Oncology

## 2021-11-14 ENCOUNTER — Ambulatory Visit (INDEPENDENT_AMBULATORY_CARE_PROVIDER_SITE_OTHER): Payer: Medicare (Managed Care) | Admitting: Family Medicine

## 2021-11-14 ENCOUNTER — Encounter: Payer: Self-pay | Admitting: Family Medicine

## 2021-11-14 VITALS — BP 112/78 | HR 74 | Temp 98.1°F | Resp 16 | Ht 73.0 in | Wt 320.0 lb

## 2021-11-14 DIAGNOSIS — E785 Hyperlipidemia, unspecified: Secondary | ICD-10-CM | POA: Diagnosis not present

## 2021-11-14 DIAGNOSIS — Z23 Encounter for immunization: Secondary | ICD-10-CM

## 2021-11-14 DIAGNOSIS — R0789 Other chest pain: Secondary | ICD-10-CM

## 2021-11-14 DIAGNOSIS — Z7689 Persons encountering health services in other specified circumstances: Secondary | ICD-10-CM

## 2021-11-14 DIAGNOSIS — F200 Paranoid schizophrenia: Secondary | ICD-10-CM

## 2021-11-14 DIAGNOSIS — E1169 Type 2 diabetes mellitus with other specified complication: Secondary | ICD-10-CM

## 2021-11-14 DIAGNOSIS — Z794 Long term (current) use of insulin: Secondary | ICD-10-CM

## 2021-11-14 MED ORDER — OMEPRAZOLE 20 MG PO CPDR: 20.0000 mg | DELAYED_RELEASE_CAPSULE | Freq: Every day | ORAL | 3 refills | Status: DC

## 2021-11-15 LAB — ALT: ALT: 59 IU/L — ABNORMAL HIGH (ref 0–44)

## 2021-11-15 LAB — LIPID PANEL
Chol/HDL Ratio: 3 ratio (ref 0.0–5.0)
Cholesterol, Total: 128 mg/dL (ref 100–199)
HDL: 43 mg/dL (ref >39)
LDL Chol Calc (NIH): 61 mg/dL (ref 0–99)
Triglycerides: 137 mg/dL (ref 0–149)
VLDL Cholesterol Cal: 24 mg/dL (ref 5–40)

## 2021-11-15 LAB — BASIC METABOLIC PANEL
BUN/Creatinine Ratio: 14 (ref 9–20)
BUN: 17 mg/dL (ref 6–24)
CO2: 26 mmol/L (ref 20–29)
Calcium: 10.5 mg/dL — ABNORMAL HIGH (ref 8.7–10.2)
Chloride: 98 mmol/L (ref 96–106)
Creatinine, Ser: 1.25 mg/dL (ref 0.76–1.27)
Glucose: 82 mg/dL (ref 70–99)
Potassium: 4.3 mmol/L (ref 3.5–5.2)
Sodium: 140 mmol/L (ref 134–144)
eGFR: 69 mL/min/{1.73_m2} (ref >59)

## 2021-11-15 LAB — AST: AST: 39 IU/L (ref 0–40)

## 2021-11-15 LAB — HEMOGLOBIN A1C
Est. average glucose Bld gHb Est-mCnc: 134 mg/dL
Hgb A1c MFr Bld: 6.3 % — ABNORMAL HIGH (ref 4.8–5.6)

## 2021-11-16 ENCOUNTER — Encounter: Payer: Self-pay | Admitting: Family Medicine

## 2021-11-16 NOTE — Progress Notes (Signed)
New Patient Office Visit  Subjective    Patient ID: Lucas Escobar, male    DOB: 1970-07-23  Age: 51 y.o. MRN: [id number]  CC:  Chief Complaint  Patient presents with   Establish Care    HPI Lucas Escobar presents to establish care and for review of chronic med issues including diabetes. Patient also reports that he has had intermittent chest discomfort. He denies SOB. Sx for several months.    Outpatient Encounter Medications as of 11/14/2021  Medication Sig   ARIPiprazole (ABILIFY) 20 MG tablet Take 20 mg by mouth daily.   buPROPion (WELLBUTRIN XL) 150 MG 24 hr tablet bupropion HCl XL 150 mg 24 hr tablet, extended release   fenofibrate 160 MG tablet SMARTSIG:1 Tablet(s) By Mouth Every Evening   fluticasone (FLONASE) 50 MCG/ACT nasal spray Place 2 sprays into both nostrils daily.   Insulin Glargine (BASAGLAR KWIKPEN [place]) Inject 12 Units into the skin daily.   metFORMIN (GLUCOPHAGE) 500 MG tablet Take by mouth 2 (two) times daily with a meal.   omeprazole (PRILOSEC) 20 MG capsule Take 1 capsule (20 mg total) by mouth daily.   simvastatin (ZOCOR) 40 MG tablet Take 40 mg by mouth at bedtime.   SUMAtriptan (IMITREX) 50 MG tablet Take by mouth.   traZODone (DESYREL) 100 MG tablet 100 mg at bedtime.    valACYclovir (VALTREX) 1000 MG tablet Take 1,000 mg by mouth 2 (two) times daily. (Patient not taking: Reported on 06/25/2021)   Vilazodone HCl 20 MG TABS Take by mouth.   No facility-administered encounter medications on file as of 11/14/2021.    Past Medical History:  Diagnosis Date   Allergy    Diabetes mellitus without complication ([place])    Head and neck cancer [facility])     Past Surgical History:  Procedure Laterality Date   CHOLECYSTECTOMY  2004   PORTA CATH INSERTION N/A 09/20/2018   Procedure: PORTA CATH INSERTION;  Surgeon: Algernon Huxley, MD;  Location: [place] [facility];  Service: Cardiovascular;  Laterality: N/A;    Family History  Problem Relation Age of  Onset   Colon cancer Father    Cancer Father    Colon cancer Paternal Grandmother     Social History   Socioeconomic History   Marital status: Married    Spouse name: Not on file   Number of children: Not on file   Years of education: Not on file   Highest education level: Not on file  Occupational History   Not on file  Tobacco Use   Smoking status: Former    Packs/day: 0.00    Years: 25.00    Total pack years: 0.00    Types: Cigarettes    Quit date: 05/02/2018    Years since quitting: 3.5   Smokeless tobacco: Former    Types: Snuff, Chew   Tobacco comments:    chewed as well  Substance and Sexual Activity   Alcohol use: Not Currently   Drug use: Not Currently   Sexual activity: Yes    Birth control/protection: None  Other Topics Concern   Not on file  Social History Narrative   Not on file   Social Determinants of Health   Financial Resource Strain: Not on file  Food Insecurity: Not on file  Transportation Needs: Not on file  Physical Activity: Not on file  Stress: Not on file  Social Connections: Not on file  Intimate Partner Violence: Not on file    Review of Systems  Constitutional:  Negative for chills and fever.  Respiratory:  Negative for shortness of breath.   Cardiovascular:  Positive for chest pain. Negative for leg swelling.  All other systems reviewed and are negative.       Objective    BP 112/78   Pulse 74   Temp 98.1 F (36.7 C) (Oral)   Resp 16   Ht '6\' 1"'$  (1.854 m)   Wt (!) 320 lb (145.2 kg)   SpO2 93%   BMI 42.22 kg/m   Physical Exam Vitals and nursing note reviewed.  Constitutional:      General: He is not in acute distress.    Appearance: He is obese.  Cardiovascular:     Rate and Rhythm: Normal rate and regular rhythm.  Pulmonary:     Effort: Pulmonary effort is normal.     Breath sounds: Normal breath sounds.  Abdominal:     Palpations: Abdomen is soft.     Tenderness: There is no abdominal tenderness.   Neurological:     General: No focal deficit present.     Mental Status: He is alert and oriented to person, place, and time.  Psychiatric:        Mood and Affect: Mood normal. Affect is blunt.         Assessment & Plan:   1. Type 2 diabetes mellitus with other specified complication, with long-term current use of insulin ([place]) Monitoring labs ordered. Continue  - Hemoglobin O7F - Basic Metabolic Panel - Lipid Panel - AST - ALT  2. Hyperlipidemia, unspecified hyperlipidemia type continue  3. Atypical chest pain EKG ordered. Omeprazole prescribed. Referral to cardiology for further eval/mgt - EKG 12-Lead - Ambulatory referral to Cardiology  4. Need for shingles vaccine  - Varicella-zoster vaccine IM  5. Paranoid schizophrenia ([place]) Management as per consultant  6. Encounter to establish care     No follow-ups on file.   Becky Sax, MD

## 2021-12-11 NOTE — Progress Notes (Signed)
Cardiology Office Note:    Date:  12/13/2021   ID:  Lucas Escobar, DOB [birth year], MRN [id number]  PCP:  Dorna Mai, MD  Cardiologist:  None   Referring MD: Dorna Mai, MD   Chief Complaint  Patient presents with   Chest Pain   Hyperlipidemia    History of Present Illness:    Lucas Escobar is a 51 y.o. male with a hx of squamous cell carcinoma of the head and neck 2020, DM II, hyperlipidemia, schizophrenia, who is referred by Dr. Dorna Mai due to "atypical chest pain".   He has recurring "chest pressure" that he has diagnosed as indigestion.  Episodes occur randomly and can last anywhere from 10 minutes to several hours.  After pushing, he characterizes the discomfort as a pressure.  On 1 occasion when swimming in cold water the pain radiated into the arm.  The usual intensity of the chest pressure is 3 on a scale of 10.  Risk factors for coronary disease include hyperlipidemia, diabetes greater than 5 years and frequently poorly controlled, prior tobacco use (chewing tobacco), vaping, possible sleep apnea, and maternal grandfather with myocardial infarction.  Past Medical History:  Diagnosis Date   Allergy    Diabetes mellitus without complication ([place])    Head and neck cancer [facility])     Past Surgical History:  Procedure Laterality Date   CHOLECYSTECTOMY  2004   PORTA CATH INSERTION N/A 09/20/2018   Procedure: PORTA CATH INSERTION;  Surgeon: Algernon Huxley, MD;  Location: [place] [facility];  Service: Cardiovascular;  Laterality: N/A;    Current Medications: Current Meds  Medication Sig   ARIPiprazole (ABILIFY) 20 MG tablet Take 20 mg by mouth daily.   buPROPion (WELLBUTRIN XL) 150 MG 24 hr tablet bupropion HCl XL 150 mg 24 hr tablet, extended release   FARXIGA 10 MG TABS tablet Take 10 mg by mouth daily.   fenofibrate 160 MG tablet SMARTSIG:1 Tablet(s) By Mouth Every Evening   fluticasone (FLONASE) 50 MCG/ACT nasal spray Place 2 sprays into  both nostrils daily.   icosapent Ethyl (VASCEPA) 1 g capsule Take 2 g by mouth 2 (two) times daily.   Insulin Glargine (BASAGLAR KWIKPEN [place]) Inject 12 Units into the skin daily.   metFORMIN (GLUCOPHAGE) 500 MG tablet Take by mouth 2 (two) times daily with a meal.   omeprazole (PRILOSEC) 20 MG capsule Take 1 capsule (20 mg total) by mouth daily.   rosuvastatin (CRESTOR) 20 MG tablet Take 20 mg by mouth daily.   SUMAtriptan (IMITREX) 50 MG tablet Take by mouth as needed.   traZODone (DESYREL) 100 MG tablet 100 mg at bedtime.    valACYclovir (VALTREX) 1000 MG tablet Take 1,000 mg by mouth 2 (two) times daily.     Allergies:   Iodine, Shellfish allergy, and Latex   Social History   Socioeconomic History   Marital status: Married    Spouse name: Not on file   Number of children: Not on file   Years of education: Not on file   Highest education level: Not on file  Occupational History   Not on file  Tobacco Use   Smoking status: Former    Packs/day: 0.00    Years: 25.00    Total pack years: 0.00    Types: Cigarettes    Quit date: 05/02/2018    Years since quitting: 3.6   Smokeless tobacco: Former    Types: Snuff, Chew   Tobacco comments:    chewed as  well  Substance and Sexual Activity   Alcohol use: Not Currently   Drug use: Not Currently   Sexual activity: Yes    Birth control/protection: None  Other Topics Concern   Not on file  Social History Narrative   Not on file   Social Determinants of Health   Financial Resource Strain: Not on file  Food Insecurity: Not on file  Transportation Needs: Not on file  Physical Activity: Not on file  Stress: Not on file  Social Connections: Not on file     Family History: The patient's family history includes Cancer in his father; Colon cancer in his father and paternal grandmother.  ROS:   Please see the history of present illness.    Sleeps well.  Denies orthopnea and PND.  Has a schizoaffective disorder and sees a  psychiatrist at [facility].  All other systems reviewed and are negative.  EKGs/Labs/Other Studies Reviewed:    The following studies were reviewed today: No imaging  EKG:  EKG performed on 11/14/2021 reveals a normal tracing with sinus rhythm.  Recent Labs: 11/14/2021: ALT 59; BUN 17; Creatinine, Ser 1.25; Potassium 4.3; Sodium 140  Recent Lipid Panel    Component Value Date/Time   CHOL 128 11/14/2021 1604   TRIG 137 11/14/2021 1604   HDL 43 11/14/2021 1604   CHOLHDL 3.0 11/14/2021 1604   LDLCALC 61 11/14/2021 1604    Physical Exam:    VS:  BP 100/66   Pulse 69   Ht '6\' 1"'$  (1.854 m)   Wt (!) 312 lb 6.4 oz (141.7 kg)   SpO2 96%   BMI 41.22 kg/m     Wt Readings from Last 3 Encounters:  12/13/21 (!) 312 lb 6.4 oz (141.7 kg)  11/14/21 (!) 320 lb (145.2 kg)  06/25/21 (!) 322 lb (146.1 kg)     GEN: Morbid with BMI 41. No acute distress HEENT: Normal NECK: No JVD. LYMPHATICS: No lymphadenopathy CARDIAC: No murmur. RRR no gallop, or edema. VASCULAR:  Normal Pulses. No bruits. RESPIRATORY:  Clear to auscultation without rales, wheezing or rhonchi  ABDOMEN: Soft, non-tender, non-distended, No pulsatile mass, MUSCULOSKELETAL: No deformity  SKIN: Warm and dry NEUROLOGIC:  Alert and oriented x 3 PSYCHIATRIC:  Normal affect   ASSESSMENT:    1. Chest pain of uncertain etiology   2. Morbid obesity ([place])   3. Paranoid schizophrenia ([place])    PLAN:    In order of problems listed above:  Significant risk factors with features suggesting atypical/nonischemic chest pain however risk factors include diabetes and hyperlipidemia.  Should have coronary CT angiogram.  Has relatively low blood pressure and relatively controlled heart rate at rest.  Would not be able to receive beta-blocker therapy but Corlanor would be helpful.  Also has "shellfish allergy".  Will go ahead and provide iodine contrast allergy prophylaxis. No recommendations other than counseling concerning weight  loss.  He snores and at some point needs to have a sleep study done.   Medication Adjustments/Labs and Tests Ordered: Current medicines are reviewed at length with the patient today.  Concerns regarding medicines are outlined above.  No orders of the defined types were placed in this encounter.  No orders of the defined types were placed in this encounter.   There are no Patient Instructions on file for this visit.   Signed, Sinclair Grooms, MD  12/13/2021 9:42 AM    [place]

## 2021-12-13 ENCOUNTER — Encounter: Payer: Self-pay | Admitting: Interventional Cardiology

## 2021-12-13 ENCOUNTER — Other Ambulatory Visit (HOSPITAL_COMMUNITY): Payer: Self-pay

## 2021-12-13 ENCOUNTER — Ambulatory Visit: Payer: Medicare (Managed Care) | Attending: Interventional Cardiology | Admitting: Interventional Cardiology

## 2021-12-13 VITALS — BP 100/66 | HR 69 | Ht 73.0 in | Wt 312.4 lb

## 2021-12-13 DIAGNOSIS — F200 Paranoid schizophrenia: Secondary | ICD-10-CM | POA: Diagnosis not present

## 2021-12-13 DIAGNOSIS — R079 Chest pain, unspecified: Secondary | ICD-10-CM | POA: Diagnosis not present

## 2021-12-13 DIAGNOSIS — Z01812 Encounter for preprocedural laboratory examination: Secondary | ICD-10-CM

## 2021-12-13 LAB — BASIC METABOLIC PANEL
BUN/Creatinine Ratio: 13 (ref 9–20)
BUN: 17 mg/dL (ref 6–24)
CO2: 23 mmol/L (ref 20–29)
Calcium: 10 mg/dL (ref 8.7–10.2)
Chloride: 101 mmol/L (ref 96–106)
Creatinine, Ser: 1.29 mg/dL — ABNORMAL HIGH (ref 0.76–1.27)
Glucose: 95 mg/dL (ref 70–99)
Potassium: 4.3 mmol/L (ref 3.5–5.2)
Sodium: 140 mmol/L (ref 134–144)
eGFR: 50 mL/min/{1.73_m2} — ABNORMAL LOW (ref >59)

## 2021-12-13 MED ORDER — DIPHENHYDRAMINE HCL 50 MG PO CAPS
ORAL_CAPSULE | ORAL | 0 refills | Status: AC
  Filled 2021-12-13: qty 1, fill #0

## 2021-12-13 MED ORDER — PREDNISONE 50 MG PO TABS
ORAL_TABLET | ORAL | 0 refills | Status: AC
  Filled 2021-12-13: qty 3, 1d supply, fill #0

## 2021-12-13 MED ORDER — IVABRADINE HCL 5 MG PO TABS
10.0000 mg | ORAL_TABLET | Freq: Once | ORAL | 0 refills | Status: AC
  Filled 2021-12-13: qty 2, 1d supply, fill #0

## 2021-12-13 NOTE — Patient Instructions (Addendum)
Medication Instructions:  Your physician recommends that you continue on your current medications as directed. Please refer to the Current Medication list given to you today.  *If you need a refill on your cardiac medications before your next appointment, please call your pharmacy*  Lab Work: TODAY: BMET If you have labs (blood work) drawn today and your tests are completely normal, you will receive your results only by: [place] (if you have MyChart) OR A paper copy in the mail If you have any lab test that is abnormal or we need to change your treatment, we will call you to review the results.  Testing/Procedures: Your physician has requested you have a coronary CTA performed.  Follow-Up: Will be determined based on results of coronary CTA.  Other Instructions   Your cardiac CT will be scheduled at:   [facility] [address] [place], [place] [address] [phone]  Please arrive at the [facility] and Children's Entrance (Entrance C2) of [facility] 30 minutes prior to test start time. You can use the FREE valet parking offered at entrance C (encouraged to control the heart rate for the test)  Proceed to the [facility] Radiology Department (first floor) to check-in and test prep.  All radiology patients and guests should use entrance C2 at [facility], accessed from [facility], even though the hospital's physical address listed is [address].    Please follow these instructions carefully (unless otherwise directed):  Hold all erectile dysfunction medications at least 3 days (72 hrs) prior to test. (Ie viagra, cialis, sildenafil, tadalafil, etc) We will administer nitroglycerin during this exam.   On the Night Before the Test: Be sure to Drink plenty of water. Do not consume any caffeinated/decaffeinated beverages or chocolate 12 hours prior to your test. Do not take any antihistamines 12 hours prior to your  test. If the patient has contrast allergy: Patient will need a prescription for Prednisone and very clear instructions (as follows): Prednisone 50 mg - take 13 hours prior to test Take another Prednisone 50 mg 7 hours prior to test Take another Prednisone 50 mg 1 hour prior to test Take Benadryl 50 mg 1 hour prior to test Patient must complete all four doses of above prophylactic medications. Patient will need a ride after test due to Benadryl.  On the Day of the Test: Drink plenty of water until 1 hour prior to the test. Do not eat any food 1 hour prior to test. You may take your regular medications prior to the test.  Take ivabradine (Corlanor) '10mg'$  1.5-2 hours prior to test.  Medications for CT scan have been sent to [facility] at [address] in [place]. Their phone number is [phone].      After the Test: Drink plenty of water. After receiving IV contrast, you may experience a mild flushed feeling. This is normal. On occasion, you may experience a mild rash up to 24 hours after the test. This is not dangerous. If this occurs, you can take Benadryl 25 mg and increase your fluid intake. If you experience trouble breathing, this can be serious. If it is severe call 911 IMMEDIATELY. If it is mild, please call our office. If you take any of these medications: Glipizide/Metformin, Avandament, Glucavance, please do not take 48 hours after completing test unless otherwise instructed.  We will call to schedule your test 2-4 weeks out understanding that some insurance companies will need an authorization  prior to the service being performed.   For non-scheduling related questions, please contact the cardiac imaging nurse navigator should you have any questions/concerns: Marchia Bond, Cardiac Imaging Nurse Navigator Gordy Clement, Cardiac Imaging Nurse Navigator [facility] Direct Office Dial: [phone]   For scheduling needs,  including cancellations and rescheduling, please call [place], [phone].   Important Information About [place]

## 2021-12-25 ENCOUNTER — Inpatient Hospital Stay: Payer: Medicare (Managed Care) | Admitting: Oncology

## 2021-12-26 ENCOUNTER — Telehealth (HOSPITAL_COMMUNITY): Payer: Self-pay | Admitting: Emergency Medicine

## 2021-12-26 NOTE — Telephone Encounter (Signed)
Attempted to call patient regarding upcoming cardiac CT appointment. °Left message on voicemail with name and callback number °Taeja Debellis RN Navigator Cardiac Imaging °[facility] °[phone] Office °[phone] Cell ° °

## 2021-12-26 NOTE — Progress Notes (Signed)
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  INTERVAL HISTORY: Patient returns to clinic today for routine 27-monthevaluation.  He currently feels well and is asymptomatic.  He denies any pain or dysphagia. He has a good appetite and denies weight loss.  He has no neurologic complaints.  He denies any recent fevers or illnesses.  He denies any chest pain, shortness of breath, cough, or hemoptysis.  He has no nausea, vomiting, constipation, or diarrhea.  He has no urinary complaints.  Patient feels at his baseline and offers no specific complaints today.  REVIEW OF SYSTEMS:   Review of Systems  Constitutional: Negative.  Negative for fever, malaise/fatigue and weight loss.  HENT:  Negative for sinus pain and sore throat.   Respiratory: Negative.  Negative for cough, hemoptysis and shortness of breath.   Cardiovascular: Negative.  Negative for chest pain and leg swelling.  Gastrointestinal: Negative.  Negative for abdominal pain.  Genitourinary: Negative.  Negative for dysuria.  Musculoskeletal: Negative.  Negative for back pain and neck pain.  Skin: Negative.  Negative for rash.  Neurological: Negative.  Negative for dizziness, speech change, focal weakness, weakness and headaches.  Psychiatric/Behavioral: Negative.  The patient is not nervous/anxious.     As per HPI. Otherwise, a complete review of systems is negative.  PAST MEDICAL HISTORY: Paranoid  schizophrenia. Past Medical History:  Diagnosis Date   Allergy    Diabetes mellitus without complication ([facility]    [place]     PAST SURGICAL HISTORY: Cholecystectomy.  FAMILY HISTORY: Family History  Problem Relation Age of Onset   Colon cancer Father    Cancer Father    Colon cancer Paternal Grandmother     ADVANCED DIRECTIVES (Y/N):  N  HEALTH MAINTENANCE: Social History   Tobacco Use   Smoking status: Former    Packs/day: 0.00    Years: 25.00    Total pack years: 0.00    Types: Cigarettes    Quit date: 05/02/2018    Years since quitting: 3.6   Smokeless tobacco: Former    Types: Snuff, Chew   Tobacco comments:    chewed as well  Vaping Use   Vaping Use: Every day   Substances: Nicotine  Substance Use Topics   Alcohol use: Not Currently   Drug use: Not Currently     Colonoscopy:  PAP:  Bone density:  Lipid panel:  Allergies  Allergen Reactions   Iodine Swelling   Shellfish Allergy Swelling   Latex Rash    Current Outpatient Medications  Medication Sig Dispense Refill   ARIPiprazole (ABILIFY) 20 MG tablet Take 20 mg by mouth daily.     buPROPion (WELLBUTRIN XL) 150 MG 24 hr tablet bupropion HCl XL 150 mg 24 hr tablet, extended release     diphenhydrAMINE (BENADRYL) 50 MG capsule Take one capsule 1 hour prior to scan. 1 capsule 0   FARXIGA 10 MG TABS tablet Take 10 mg by mouth daily.     fenofibrate 160 MG tablet SMARTSIG:1 Tablet(s) By Mouth Every Evening  fluticasone (FLONASE) 50 MCG/ACT nasal spray Place 2 sprays into both nostrils daily.     icosapent Ethyl (VASCEPA) 1 g capsule Take 2 g by mouth 2 (two) times daily.     Insulin Glargine (BASAGLAR KWIKPEN [place]) Inject 12 Units into the skin daily.     metFORMIN (GLUCOPHAGE) 500 MG tablet Take by mouth 2 (two) times daily with a meal.     omeprazole (PRILOSEC) 20 MG capsule Take 1 capsule (20 mg total) by mouth daily. 30 capsule 3   predniSONE (DELTASONE) 50 MG tablet Take one  tablet (50 mg) 13 hours, 7 hours, and 1 hour prior to scan. 3 tablet 0   rosuvastatin (CRESTOR) 20 MG tablet Take 20 mg by mouth daily.     SUMAtriptan (IMITREX) 50 MG tablet Take by mouth as needed.     traZODone (DESYREL) 100 MG tablet 100 mg at bedtime.      valACYclovir (VALTREX) 1000 MG tablet Take 1,000 mg by mouth 2 (two) times daily.     Vilazodone HCl 20 MG TABS Take by mouth.     No current facility-administered medications for this visit.   Facility-Administered Medications Ordered in Other Visits  Medication Dose Route Frequency Provider Last Rate Last Admin   metoprolol tartrate (LOPRESSOR) 5 MG/5ML injection            metoprolol tartrate (LOPRESSOR) injection 5 mg  5 mg Intravenous Q5 min PRN O'Neal, Cassie Freer, MD       nitroGLYCERIN (NITROSTAT) 0.4 MG SL tablet             OBJECTIVE: Vitals:   12/27/21 1020  BP: 118/69  Pulse: 63  Resp: 18  Temp: (!) 96.3 F (35.7 C)  SpO2: 98%      Body mass index is 40.7 kg/m.    ECOG FS:0 - Asymptomatic  General: Well-developed, well-nourished, no acute distress. Eyes: Pink conjunctiva, anicteric sclera. HEENT: Normocephalic, moist mucous membranes.  No palpable lymphadenopathy. Lungs: No audible wheezing or coughing. Heart: Regular rate and rhythm. Abdomen: Soft, nontender, no obvious distention. Musculoskeletal: No edema, cyanosis, or clubbing. Neuro: Alert, answering all questions appropriately. Cranial nerves grossly intact. Skin: No rashes or petechiae noted. Psych: Normal affect.  LAB RESULTS:  Lab Results  Component Value Date   NA 140 12/13/2021   K 4.3 12/13/2021   CL 101 12/13/2021   CO2 23 12/13/2021   GLUCOSE 95 12/13/2021   BUN 17 12/13/2021   CREATININE 1.29 (H) 12/13/2021   CALCIUM 10.0 12/13/2021   AST 39 11/14/2021   ALT 59 (H) 11/14/2021   GFRNONAA >60 12/23/2018   GFRAA >60 12/23/2018    Lab Results  Component Value Date   WBC 4.9 12/23/2018   NEUTROABS 2.9 12/23/2018   HGB 13.0  12/23/2018   HCT 38.4 (L) 12/23/2018   MCV 97.0 12/23/2018   PLT 173 12/23/2018     STUDIES: CT CORONARY MORPH W/CTA COR W/SCORE W/CA W/CM &/OR WO/CM  Addendum Date: 12/27/2021   ADDENDUM REPORT: 12/27/2021 10:43 EXAM: OVER-READ INTERPRETATION CARDIAC CT CHEST The following report is an over-read performed by radiologist Dr. Van Clines of [facility] on 12/27/2021. This over-read does not include interpretation of cardiac or coronary anatomy or pathology. The coronary CTA interpretation by the cardiologist is attached. COMPARISON:  PET-CT 03/14/2021 FINDINGS: Extracardiac Vascular: Unremarkable Mediastinum: Unremarkable Lung: Faint bandlike regions of ground-glass density anteriorly in both lower lobes, nonspecific but possibly from mild volume loss. Mild scarring in the lingula. Included Upper  Abdomen: Unremarkable Musculoskeletal: Thoracic spondylosis. IMPRESSION: Faint bands of probable mild volume loss anteriorly in both lower lobes. Electronically Signed   By: Van Clines M.D.   On: 12/27/2021 10:43   Result Date: 12/27/2021 CLINICAL DATA:  Chest pain EXAM: Cardiac/Coronary CTA TECHNIQUE: A non-contrast, gated CT scan was obtained with axial slices of 3 mm through the heart for calcium scoring. Calcium scoring was performed using the Agatston method. A 100 kV prospective, gated, contrast cardiac scan was obtained. Gantry rotation speed was 250 msecs and collimation was 0.6 mm. Two sublingual nitroglycerin tablets (0.8 mg) were given. The 3D data set was reconstructed in 5% intervals of the 35-75% of the R-R cycle. Diastolic phases were analyzed on a dedicated workstation using MPR, MIP, and VRT modes. The patient received 95 cc of contrast. FINDINGS: Image quality: Excellent. Noise artifact is: Limited. Coronary Arteries:  Normal coronary origin.  Right dominance. Left main: The left main is a large caliber vessel with a normal take off from the left coronary cusp that  bifurcates to form a left anterior descending artery and a left circumflex artery. There is no plaque or stenosis. Left anterior descending artery: There is minimal calcified plaque in the proximal LAD (<25%). The mid and distal segments contains minimal non-calcified plaque (<25%). The LAD gives off 2 patent diagonal branches. Left circumflex artery: The LCX is non-dominant. There is minimal non-calcified plaque (<25%). The LCX gives off 1 patent obtuse marginal branches. Right coronary artery: The RCA is dominant with normal take off from the right coronary cusp. The proximal RCA contains minimal mixed density plaque (<25%). There is minimal non-calcified plaque in the distal segments (<25%). The RCA terminates as a PDA and right posterolateral branch without evidence of plaque or stenosis. Right Atrium: Right atrial size is within normal limits. Right Ventricle: The right ventricular cavity is within normal limits. Left Atrium: Left atrial size is normal in size with no left atrial appendage filling defect. Small PFO. Left Ventricle: The ventricular cavity size is within normal limits. Pulmonary arteries: Normal in size without proximal filling defect. Pulmonary veins: Normal pulmonary venous drainage. Pericardium: Normal thickness without significant effusion or calcium present. Cardiac valves: The aortic valve is trileaflet without significant calcification. The mitral valve is normal without significant calcification. Aorta: Normal caliber without significant disease. Extra-cardiac findings: See attached radiology report for non-cardiac structures. IMPRESSION: 1. Coronary calcium score of 14.3. This was 62nd percentile for age-, sex, and race-matched controls. 2. Normal coronary origin with right dominance. 3. Minimal mixed density plaque in the LAD/RCA (<25%). 4. Minimal non-calcified plaque (<25%) in the LCX. 5. Small PFO. RECOMMENDATIONS: 1. Minimal non-obstructive CAD (0-24%). Consider non-atherosclerotic  causes of chest pain. Consider preventive therapy and risk factor modification. Eleonore Chiquito, MD Electronically Signed: By: Eleonore Chiquito M.D. 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  PLAN:    1.  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  PET scan results from March 15, 2019 reviewed independently with no obvious evidence of recurrent or progressive disease.  By report, patient had positive biopsy for recurrent squamous cell carcinoma in his left buccal mucosa, but we do not have these results.  Repeat procedure completed on February 15, 2021 did not reveal any residual squamous carcinoma including no tumor at initial biopsy/resection margins.  PET scan results from March 15, 2021  reviewed independently with no hypermetabolic lesions to suggest recurrence.  Patient missed his ENT evaluation last week secondary to his wife being in the hospital, reports his endoscopy 3 months ago revealed no evidence of disease.  He has been instructed to keep his next follow-up appointment with ENT in approximately 3 months.  Return to clinic in 6 months for routine evaluation.   2.  Pain: Resolved. 3.  Paranoid schizophrenia: Continue current medications.  Monitor.  I spent a total of 20 minutes reviewing chart data, face-to-face evaluation with the patient, counseling and coordination of care as detailed above.    Patient expressed understanding and was in agreement with this plan. He also understands that He can call clinic at any time with any questions, concerns, or complaints.    Cancer Staging  Squamous cell carcinoma of head and neck ([place]) Staging form: Oral Cavity, AJCC 8th Edition - Clinical: Stage IVA (cT2, cN2, cM0) - Signed by Lloyd Huger, MD on 08/02/2018 Laterality: Left   Lloyd Huger, MD   12/27/2021 11:48  AM

## 2021-12-26 NOTE — Telephone Encounter (Signed)
Attempted to call patient regarding upcoming cardiac CT appointment. °Left message on voicemail with name and callback number °Leanore Biggers RN Navigator Cardiac Imaging °[facility] °[phone] Office °[phone] Cell ° °

## 2021-12-27 ENCOUNTER — Encounter: Payer: Self-pay | Admitting: Oncology

## 2021-12-27 ENCOUNTER — Encounter (HOSPITAL_COMMUNITY): Payer: Self-pay

## 2021-12-27 ENCOUNTER — Inpatient Hospital Stay: Payer: Medicare (Managed Care) | Attending: Oncology | Admitting: Oncology

## 2021-12-27 ENCOUNTER — Ambulatory Visit (INDEPENDENT_AMBULATORY_CARE_PROVIDER_SITE_OTHER): Payer: Medicare (Managed Care)

## 2021-12-27 ENCOUNTER — Ambulatory Visit (HOSPITAL_COMMUNITY)
Admission: RE | Admit: 2021-12-27 | Discharge: 2021-12-27 | Disposition: A | Discharge status: Home / Self Care | Payer: Medicare (Managed Care) | Source: Ambulatory Visit | Attending: Interventional Cardiology | Admitting: Interventional Cardiology

## 2021-12-27 VITALS — BP 118/69 | HR 63 | Temp 96.3°F | Resp 18 | Ht 73.0 in | Wt 308.5 lb

## 2021-12-27 DIAGNOSIS — C760 Malignant neoplasm of head, face and neck: Secondary | ICD-10-CM

## 2021-12-27 DIAGNOSIS — R079 Chest pain, unspecified: Secondary | ICD-10-CM

## 2021-12-27 DIAGNOSIS — Z923 Personal history of irradiation: Secondary | ICD-10-CM | POA: Diagnosis not present

## 2021-12-27 DIAGNOSIS — F200 Paranoid schizophrenia: Secondary | ICD-10-CM | POA: Insufficient documentation

## 2021-12-27 DIAGNOSIS — Z79899 Other long term (current) drug therapy: Secondary | ICD-10-CM | POA: Diagnosis not present

## 2021-12-27 DIAGNOSIS — Z9221 Personal history of antineoplastic chemotherapy: Secondary | ICD-10-CM | POA: Diagnosis not present

## 2021-12-27 DIAGNOSIS — I2510 Atherosclerotic heart disease of native coronary artery without angina pectoris: Secondary | ICD-10-CM | POA: Diagnosis not present

## 2021-12-27 DIAGNOSIS — Z0000 Encounter for general adult medical examination without abnormal findings: Secondary | ICD-10-CM

## 2021-12-27 MED ORDER — IOHEXOL 350 MG/ML SOLN
100.0000 mL | Freq: Once | INTRAVENOUS | Status: AC | PRN
  Administered 2021-12-27: 100 mL via INTRAVENOUS

## 2021-12-27 MED ORDER — METOPROLOL TARTRATE 5 MG/5ML IV SOLN
INTRAVENOUS | Status: AC
  Filled 2021-12-27: qty 5

## 2021-12-27 MED ORDER — NITROGLYCERIN 0.4 MG SL SUBL
SUBLINGUAL_TABLET | SUBLINGUAL | Status: AC
  Filled 2021-12-27: qty 2

## 2021-12-27 MED ORDER — METOPROLOL TARTRATE 5 MG/5ML IV SOLN: 5.0000 mg | INTRAVENOUS | Status: DC | PRN

## 2021-12-27 MED ORDER — NITROGLYCERIN 0.4 MG SL SUBL
0.8000 mg | SUBLINGUAL_TABLET | Freq: Once | SUBLINGUAL | Status: AC
  Administered 2021-12-27: 0.8 mg via SUBLINGUAL

## 2021-12-27 NOTE — Patient Instructions (Signed)
Health Maintenance, Male Adopting a healthy lifestyle and getting preventive care are important in promoting health and wellness. Ask your health care provider about: The right schedule for you to have regular tests and exams. Things you can do on your own to prevent diseases and keep yourself healthy. What should I know about diet, weight, and exercise? Eat a healthy diet  Eat a diet that includes plenty of vegetables, fruits, low-fat dairy products, and lean protein. Do not eat a lot of foods that are high in solid fats, added sugars, or sodium. Maintain a healthy weight Body mass index (BMI) is a measurement that can be used to identify possible weight problems. It estimates body fat based on height and weight. Your health care provider can help determine your BMI and help you achieve or maintain a healthy weight. Get regular exercise Get regular exercise. This is one of the most important things you can do for your health. Most adults should: Exercise for at least 150 minutes each week. The exercise should increase your heart rate and make you sweat (moderate-intensity exercise). Do strengthening exercises at least twice a week. This is in addition to the moderate-intensity exercise. Spend less time sitting. Even light physical activity can be beneficial. Watch cholesterol and blood lipids Have your blood tested for lipids and cholesterol at 53 years of age, then have this test every 5 years. You may need to have your cholesterol levels checked more often if: Your lipid or cholesterol levels are high. You are older than 53 years of age. You are at high risk for heart disease. What should I know about cancer screening? Many types of cancers can be detected early and may often be prevented. Depending on your health history and family history, you may need to have cancer screening at various ages. This may include screening for: Colorectal cancer. Prostate cancer. Skin cancer. Lung  cancer. What should I know about heart disease, diabetes, and high blood pressure? Blood pressure and heart disease High blood pressure causes heart disease and increases the risk of stroke. This is more likely to develop in people who have high blood pressure readings or are overweight. Talk with your health care provider about your target blood pressure readings. Have your blood pressure checked: Every 3-5 years if you are 18-39 years of age. Every year if you are 40 years old or older. If you are between the ages of 65 and 75 and are a current or former smoker, ask your health care provider if you should have a one-time screening for abdominal aortic aneurysm (AAA). Diabetes Have regular diabetes screenings. This checks your fasting blood sugar level. Have the screening done: Once every three years after age 45 if you are at a normal weight and have a low risk for diabetes. More often and at a younger age if you are overweight or have a high risk for diabetes. What should I know about preventing infection? Hepatitis B If you have a higher risk for hepatitis B, you should be screened for this virus. Talk with your health care provider to find out if you are at risk for hepatitis B infection. Hepatitis C Blood testing is recommended for: Everyone born from 1945 through 1965. Anyone with known risk factors for hepatitis C. Sexually transmitted infections (STIs) You should be screened each year for STIs, including gonorrhea and chlamydia, if: You are sexually active and are younger than 53 years of age. You are older than 53 years of age and your   health care provider tells you that you are at risk for this type of infection. Your sexual activity has changed since you were last screened, and you are at increased risk for chlamydia or gonorrhea. Ask your health care provider if you are at risk. Ask your health care provider about whether you are at high risk for HIV. Your health care provider  may recommend a prescription medicine to help prevent HIV infection. If you choose to take medicine to prevent HIV, you should first get tested for HIV. You should then be tested every 3 months for as long as you are taking the medicine. Follow these instructions at home: Alcohol use Do not drink alcohol if your health care provider tells you not to drink. If you drink alcohol: Limit how much you have to 0-2 drinks a day. Know how much alcohol is in your drink. In the [place] one drink equals one 12 oz bottle of beer (355 mL), one 5 oz glass of wine (148 mL), or one 1 oz glass of hard liquor (44 mL). Lifestyle Do not use any products that contain nicotine or tobacco. These products include cigarettes, chewing tobacco, and vaping devices, such as e-cigarettes. If you need help quitting, ask your health care provider. Do not use street drugs. Do not share needles. Ask your health care provider for help if you need support or information about quitting drugs. General instructions Schedule regular health, dental, and eye exams. Stay current with your vaccines. Tell your health care provider if: You often feel depressed. You have ever been abused or do not feel safe at home. Summary Adopting a healthy lifestyle and getting preventive care are important in promoting health and wellness. Follow your health care provider's instructions about healthy diet, exercising, and getting tested or screened for diseases. Follow your health care provider's instructions on monitoring your cholesterol and blood pressure. This information is not intended to replace advice given to you by your health care provider. Make sure you discuss any questions you have with your health care provider. Document Revised: 07/09/2020 Document Reviewed: 07/09/2020 Elsevier Patient Education  2023 [employer]  

## 2021-12-27 NOTE — Progress Notes (Signed)
Subjective:   Lucas Escobar is a 51 y.o. male who presents for an Initial Medicare Annual Wellness Visit.  I connected with  Jessy Oto Alix on 12/27/21 by a audio enabled telemedicine application and verified that I am speaking with the correct person using two identifiers.  Patient Location: Home  Provider Location: Office/Clinic  I discussed the limitations of evaluation and management by telemedicine. The patient expressed understanding and agreed to proceed.       Objective:    There were no vitals filed for this visit. There is no height or weight on file to calculate BMI.     12/27/2021   10:23 AM 06/25/2021   10:15 AM 04/10/2020    1:41 PM 09/09/2019   10:58 AM 07/05/2019    2:07 PM 03/15/2019    1:45 PM 12/24/2018   10:07 AM  Advanced Directives  Does Patient Have a Medical Advance Directive? Yes Yes Yes Yes Yes Yes Yes  Type of Paramedic of [place];Living will [place];Living will [place];Living will Healthcare Power of [place];Living will [place];Living will [place];Living will  Does patient want to make changes to medical advance directive?  No - Patient declined       Copy of [place] in Chart?  No - copy requested  No - copy requested No - copy requested    Would patient like information on creating a medical advance directive?  No - Patient declined  No - Patient declined       Current Medications (verified) Outpatient Encounter Medications as of 12/27/2021  Medication Sig   ARIPiprazole (ABILIFY) 20 MG tablet Take 20 mg by mouth daily.   buPROPion (WELLBUTRIN XL) 150 MG 24 hr tablet bupropion HCl XL 150 mg 24 hr tablet, extended release   diphenhydrAMINE (BENADRYL) 50 MG capsule Take one capsule 1 hour prior to scan.   FARXIGA 10 MG TABS tablet Take 10 mg by mouth daily.   fenofibrate 160 MG  tablet SMARTSIG:1 Tablet(s) By Mouth Every Evening   fluticasone (FLONASE) 50 MCG/ACT nasal spray Place 2 sprays into both nostrils daily.   icosapent Ethyl (VASCEPA) 1 g capsule Take 2 g by mouth 2 (two) times daily.   Insulin Glargine (BASAGLAR KWIKPEN [place]) Inject 12 Units into the skin daily.   metFORMIN (GLUCOPHAGE) 500 MG tablet Take by mouth 2 (two) times daily with a meal.   omeprazole (PRILOSEC) 20 MG capsule Take 1 capsule (20 mg total) by mouth daily.   predniSONE (DELTASONE) 50 MG tablet Take one tablet (50 mg) 13 hours, 7 hours, and 1 hour prior to scan.   rosuvastatin (CRESTOR) 20 MG tablet Take 20 mg by mouth daily.   simvastatin (ZOCOR) 40 MG tablet Take 40 mg by mouth at bedtime. (Patient not taking: Reported on 12/13/2021)   SUMAtriptan (IMITREX) 50 MG tablet Take by mouth as needed.   traZODone (DESYREL) 100 MG tablet 100 mg at bedtime.    valACYclovir (VALTREX) 1000 MG tablet Take 1,000 mg by mouth 2 (two) times daily.   Vilazodone HCl 20 MG TABS Take by mouth.   Facility-Administered Encounter Medications as of 12/27/2021  Medication   metoprolol tartrate (LOPRESSOR) injection 5 mg    Allergies (verified) Iodine, Shellfish allergy, and Latex   History: Past Medical History:  Diagnosis Date   Allergy    Diabetes mellitus without complication ([place])    Head  and neck cancer [facility])    Past Surgical History:  Procedure Laterality Date   CHOLECYSTECTOMY  2004   PORTA CATH INSERTION N/A 09/20/2018   Procedure: PORTA CATH INSERTION;  Surgeon: Algernon Huxley, MD;  Location: [place] [facility];  Service: Cardiovascular;  Laterality: N/A;   Family History  Problem Relation Age of Onset   Colon cancer Father    Cancer Father    Colon cancer Paternal Grandmother    Social History   Socioeconomic History   Marital status: Married    Spouse name: Not on file   Number of children: Not on file   Years of education: Not on file   Highest education level: Not on file   Occupational History   Not on file  Tobacco Use   Smoking status: Former    Packs/day: 0.00    Years: 25.00    Total pack years: 0.00    Types: Cigarettes    Quit date: 05/02/2018    Years since quitting: 3.6   Smokeless tobacco: Former    Types: Snuff, Chew   Tobacco comments:    chewed as well  Substance and Sexual Activity   Alcohol use: Not Currently   Drug use: Not Currently   Sexual activity: Yes    Birth control/protection: None  Other Topics Concern   Not on file  Social History Narrative   Not on file   Social Determinants of Health   Financial Resource Strain: Not on file  Food Insecurity: Not on file  Transportation Needs: Not on file  Physical Activity: Not on file  Stress: Not on file  Social Connections: Not on file    Tobacco Counseling Counseling given: Not Answered Tobacco comments: chewed as well   Clinical Intake:              How often do you need to have someone help you when you read instructions, pamphlets, or other written materials from your doctor or pharmacy?: (P) 2 - Rarely  Diabetic?yes  Nutrition Risk Assessment:  Has the patient had any N/V/D within the last 2 months?  No  Does the patient have any non-healing wounds?  No  Has the patient had any unintentional weight loss or weight gain?  No   Diabetes:  Is the patient diabetic?  Yes  If diabetic, was a CBG obtained today?  No  Did the patient bring in their glucometer from home?   na How often do you monitor your CBG's? daily.   Financial Strains and Diabetes Management:  Are you having any financial strains with the device, your supplies or your medication? No .  Does the patient want to be seen by Chronic Care Management for management of their diabetes?  No  Would the patient like to be referred to a Nutritionist or for Diabetic Management?  No   Diabetic Exams:  Diabetic Eye Exam: Completed   Diabetic Foot Exam: Overdue, Pt has been advised about the  importance in completing this exam. Pt is scheduled for diabetic foot exam on REFUSED.          Activities of Daily Living    12/27/2021   11:23 AM  In your present state of health, do you have any difficulty performing the following activities:  Hearing? 1  Vision? 0  Difficulty concentrating or making decisions? 0  Walking or climbing stairs? 0  Dressing or bathing? 0  Doing errands, shopping? 0  Preparing Food and eating ? N  Using  the Toilet? N  In the past six months, have you accidently leaked urine? N  Do you have problems with loss of bowel control? N  Managing your Medications? N  Managing your Finances? N  Housekeeping or managing your Housekeeping? N    Patient Care Team: Dorna Mai, MD as PCP - General (Family Medicine) Marygrace Drought, MD as Referring Physician (Family Medicine) Lucky Cowboy Erskine Squibb, MD as Referring Physician (Vascular Surgery) Margaretha Sheffield, MD (Otolaryngology) Lloyd Huger, MD as Consulting Physician (Oncology) Noreene Filbert, MD as Referring Physician (Radiation Oncology)  Indicate any recent Medical Services you may have received from other than [facility] providers in the past year (date may be approximate).     Assessment:   This is a routine wellness examination for Lucas Escobar.  Hearing/Vision screen No results found.  Dietary issues and exercise activities discussed:     Goals Addressed   None   Depression Screen    11/14/2021    3:24 PM  PHQ 2/9 Scores  PHQ - 2 Score 0  PHQ- 9 Score 0    Fall Risk    12/27/2021   11:23 AM  [place] in the past year? 0  Number falls in past yr: 0  Injury with Fall? 0    FALL RISK PREVENTION PERTAINING TO THE HOME:  Any stairs in or around the home? Yes  If so, are there any without handrails? No  Home free of loose throw rugs in walkways, pet beds, electrical cords, etc? Yes  Adequate lighting in your home to reduce risk of falls? Yes   ASSISTIVE DEVICES UTILIZED TO  PREVENT FALLS:  Life alert? No  Use of a cane, walker or w/c? No  Grab bars in the bathroom? Yes  Shower chair or bench in shower? Yes  Elevated toilet seat or a handicapped toilet? No    Cognitive Function:        Immunizations Immunization History  Administered Date(s) Administered   Tdap 03/17/2018   Zoster Recombinat (Shingrix) 11/14/2021    TDAP status: Up to date  Flu Vaccine status: Declined, Education has been provided regarding the importance of this vaccine but patient still declined. Advised may receive this vaccine at local pharmacy or Health Dept. Aware to provide a copy of the vaccination record if obtained from local pharmacy or Health Dept. Verbalized acceptance and understanding.  Pneumococcal vaccine status: Declined,  Education has been provided regarding the importance of this vaccine but patient still declined. Advised may receive this vaccine at local pharmacy or Health Dept. Aware to provide a copy of the vaccination record if obtained from local pharmacy or Health Dept. Verbalized acceptance and understanding.   Covid-19 vaccine status: Information provided on how to obtain vaccines.   Qualifies for Shingles Vaccine? Yes   Zostavax completed Yes   Shingrix Completed?: Yes  Screening Tests Health Maintenance  Topic Date Due   COVID-19 Vaccine (1) Never done   HIV Screening  Never done   Diabetic kidney evaluation - Urine ACR  Never done   Hepatitis C Screening  Never done   INFLUENZA VACCINE  06/01/2022 (Originally 10/01/2021)   COLONOSCOPY (Pts 45-14yr Insurance coverage will need to be confirmed)  11/15/2022 (Originally 09/17/2013)   Zoster Vaccines- Shingrix (2 of 2) 01/09/2022   Diabetic kidney evaluation - GFR measurement  12/14/2022   TETANUS/TDAP  03/17/2028   HPV VACCINES  Aged Out    Health Maintenance  Health Maintenance Due  Topic Date Due  COVID-19 Vaccine (1) Never done   HIV Screening  Never done   Diabetic kidney evaluation -  Urine ACR  Never done   Hepatitis C Screening  Never done    Colorectal cancer screening: Type of screening: Colonoscopy. Completed 2015. Repeat every 10 years  Lung Cancer Screening: (Low Dose CT Chest recommended if Age 28-80 years, 30 pack-year currently smoking OR have quit w/in 15years.) does not qualify.   Lung Cancer Screening Referral: na  Additional Screening:  Hepatitis C Screening: does qualify; Completed no  Vision Screening: Recommended annual ophthalmology exams for early detection of glaucoma and other disorders of the eye. Is the patient up to date with their annual eye exam?  Yes  Who is the provider or what is the name of the office in which the patient attends annual eye exams? Unsure of name If pt is not established with a provider, would they like to be referred to a provider to establish care? No .   Dental Screening: Recommended annual dental exams for proper oral hygiene  Community Resource Referral / Chronic Care Management: CRR required this visit?  No   CCM required this visit?  No      Plan:     I have personally reviewed and noted the following in the patient's chart:   Medical and social history Use of alcohol, tobacco or illicit drugs  Current medications and supplements including opioid prescriptions. Patient is not currently taking opioid prescriptions. Functional ability and status Nutritional status Physical activity Advanced directives List of other physicians Hospitalizations, surgeries, and ER visits in previous 12 months Vitals Screenings to include cognitive, depression, and falls Referrals and appointments  In addition, I have reviewed and discussed with patient certain preventive protocols, quality metrics, and best practice recommendations. A written personalized care plan for preventive services as well as general preventive health recommendations were provided to patient.     Octavio Manns   12/27/2021   Nurse Notes:     Lucas Escobar , Thank you for taking time to come for your Medicare Wellness Visit. I appreciate your ongoing commitment to your health goals. Please review the following plan we discussed and let me know if I can assist you in the future.   These are the goals we discussed:  Goals      NONE        This is a list of the screening recommended for you and due dates:  Health Maintenance  Topic Date Due   COVID-19 Vaccine (1) Never done   HIV Screening  Never done   Yearly kidney health urinalysis for diabetes  Never done   Hepatitis C Screening: USPSTF Recommendation to screen - Ages 76-79 yo.  Never done   Flu Shot  06/01/2022*   Colon Cancer Screening  11/15/2022*   Zoster (Shingles) Vaccine (2 of 2) 01/09/2022   Yearly kidney function blood test for diabetes  12/14/2022   Tetanus Vaccine  03/17/2028   HPV Vaccine  Aged Out  *Topic was postponed. The date shown is not the original due date.

## 2021-12-30 ENCOUNTER — Other Ambulatory Visit: Payer: Self-pay | Admitting: Interventional Cardiology

## 2021-12-30 MED ORDER — ASPIRIN 81 MG PO TBEC: 81.0000 mg | DELAYED_RELEASE_TABLET | Freq: Every day | ORAL | Status: AC

## 2022-02-12 ENCOUNTER — Other Ambulatory Visit: Payer: Self-pay | Admitting: Family Medicine

## 2022-02-12 NOTE — Telephone Encounter (Signed)
Requested Prescriptions  Pending Prescriptions Disp Refills   omeprazole (PRILOSEC) 20 MG capsule [Pharmacy Med Name: OMEPRAZOLE '20MG'$  CAPSULES] 30 capsule 3    Sig: TAKE 1 CAPSULE(20 MG) BY MOUTH DAILY     Gastroenterology: Proton Pump Inhibitors Passed - 02/12/2022  8:03 AM      Passed - Valid encounter within last 12 months    Recent Outpatient Visits           3 months ago Type 2 diabetes mellitus with other specified complication, with long-term current use of insulin [facility])   Primary Care at [facility], MD       Future Appointments             Tomorrow Dorna Mai, MD Primary Care at [facility]

## 2022-02-13 ENCOUNTER — Encounter: Payer: Self-pay | Admitting: Family Medicine

## 2022-02-13 ENCOUNTER — Ambulatory Visit (INDEPENDENT_AMBULATORY_CARE_PROVIDER_SITE_OTHER): Payer: Medicare (Managed Care) | Admitting: Family Medicine

## 2022-02-13 VITALS — BP 119/76 | HR 69 | Temp 97.7°F | Resp 16 | Wt 312.0 lb

## 2022-02-13 DIAGNOSIS — E119 Type 2 diabetes mellitus without complications: Secondary | ICD-10-CM | POA: Insufficient documentation

## 2022-02-13 DIAGNOSIS — E785 Hyperlipidemia, unspecified: Secondary | ICD-10-CM

## 2022-02-13 DIAGNOSIS — Z23 Encounter for immunization: Secondary | ICD-10-CM

## 2022-02-13 DIAGNOSIS — E1169 Type 2 diabetes mellitus with other specified complication: Secondary | ICD-10-CM

## 2022-02-13 DIAGNOSIS — Z794 Long term (current) use of insulin: Secondary | ICD-10-CM | POA: Diagnosis not present

## 2022-02-13 DIAGNOSIS — E782 Mixed hyperlipidemia: Secondary | ICD-10-CM | POA: Insufficient documentation

## 2022-02-13 LAB — POCT GLYCOSYLATED HEMOGLOBIN (HGB A1C): Hemoglobin A1C: 5.8 % — AB (ref 4.0–5.6)

## 2022-02-13 MED ORDER — OMEPRAZOLE 20 MG PO CPDR: 20.0000 mg | DELAYED_RELEASE_CAPSULE | Freq: Every day | ORAL | 1 refills | Status: AC

## 2022-02-13 MED ORDER — BASAGLAR KWIKPEN 100 UNIT/ML ~~LOC~~ SOPN: PEN_INJECTOR | SUBCUTANEOUS | 5 refills | Status: DC

## 2022-02-13 MED ORDER — FREESTYLE LIBRE 2 SENSOR MISC: 1 refills | Status: DC

## 2022-02-13 MED ORDER — FARXIGA 10 MG PO TABS: 10.0000 mg | ORAL_TABLET | Freq: Every day | ORAL | 1 refills | Status: AC

## 2022-02-13 MED ORDER — METFORMIN HCL 500 MG PO TABS: 500.0000 mg | ORAL_TABLET | Freq: Two times a day (BID) | ORAL | 1 refills | Status: AC

## 2022-02-13 NOTE — Progress Notes (Signed)
Patient is here for their 3/6 month follow-up Patient has no concerns today Care gaps have been discussed with patient  

## 2022-02-17 ENCOUNTER — Encounter: Payer: Self-pay | Admitting: Family Medicine

## 2022-02-17 NOTE — Progress Notes (Signed)
Established Patient Office Visit  Subjective    Patient ID: Lucas Escobar, male    DOB: Jun 11, 1970  Age: 51 y.o. MRN: [id number]  CC:  Chief Complaint  Patient presents with   Follow-up    HPI [name] presents for routine follow up of chronic med issues. Patient denies acute complaints or concerns.    Outpatient Encounter Medications as of 02/13/2022  Medication Sig   Continuous Blood Gluc Sensor (FREESTYLE LIBRE 2 SENSOR) MISC Utilize q14 days for glucose monitoring   rizatriptan (MAXALT) 5 MG tablet Take by mouth.   simvastatin (ZOCOR) 40 MG tablet Take 40 mg by mouth daily.   [DISCONTINUED] Insulin Glargine (BASAGLAR KWIKPEN) 100 UNIT/ML SMARTSIG:12 Unit(s) SUB-Q Every Night   ARIPiprazole (ABILIFY) 20 MG tablet Take 20 mg by mouth daily.   aspirin EC 81 MG tablet Take 1 tablet (81 mg total) by mouth daily. Swallow whole.   buPROPion (WELLBUTRIN XL) 150 MG 24 hr tablet bupropion HCl XL 150 mg 24 hr tablet, extended release   diphenhydrAMINE (BENADRYL) 50 MG capsule Take one capsule 1 hour prior to scan.   FARXIGA 10 MG TABS tablet Take 1 tablet (10 mg total) by mouth daily.   fenofibrate 160 MG tablet SMARTSIG:1 Tablet(s) By Mouth Every Evening   fluticasone (FLONASE) 50 MCG/ACT nasal spray Place 2 sprays into both nostrils daily.   icosapent Ethyl (VASCEPA) 1 g capsule Take 2 g by mouth 2 (two) times daily.   Insulin Glargine (BASAGLAR KWIKPEN [place]) Inject 12 Units into the skin daily.   Insulin Glargine (BASAGLAR KWIKPEN) 100 UNIT/ML SMARTSIG:12 Unit(s) SUB-Q Every Night   metFORMIN (GLUCOPHAGE) 500 MG tablet Take 1 tablet (500 mg total) by mouth 2 (two) times daily with a meal.   omeprazole (PRILOSEC) 20 MG capsule Take 1 capsule (20 mg total) by mouth daily.   predniSONE (DELTASONE) 50 MG tablet Take one tablet (50 mg) 13 hours, 7 hours, and 1 hour prior to scan.   rosuvastatin (CRESTOR) 20 MG tablet Take 20 mg by mouth daily.   SUMAtriptan (IMITREX) 50  MG tablet Take by mouth as needed.   traZODone (DESYREL) 100 MG tablet 100 mg at bedtime.    valACYclovir (VALTREX) 1000 MG tablet Take 1,000 mg by mouth 2 (two) times daily.   Vilazodone HCl 20 MG TABS Take by mouth.   [DISCONTINUED] FARXIGA 10 MG TABS tablet Take 10 mg by mouth daily.   [DISCONTINUED] metFORMIN (GLUCOPHAGE) 500 MG tablet Take by mouth 2 (two) times daily with a meal.   [DISCONTINUED] omeprazole (PRILOSEC) 20 MG capsule Take 1 capsule (20 mg total) by mouth daily.   No facility-administered encounter medications on file as of 02/13/2022.    Past Medical History:  Diagnosis Date   Allergy    Diabetes mellitus without complication ([place])    Head and neck cancer [facility])     Past Surgical History:  Procedure Laterality Date   CHOLECYSTECTOMY  2004   PORTA CATH INSERTION N/A 09/20/2018   Procedure: PORTA CATH INSERTION;  Surgeon: Algernon Huxley, MD;  Location: [place] [facility] LAB;  Service: Cardiovascular;  Laterality: N/A;    Family History  Problem Relation Age of Onset   Colon cancer Father    Cancer Father    Colon cancer Paternal Grandmother     Social History   Socioeconomic History   Marital status: Married    Spouse name: Not on file   Number of children: Not on file  Years of education: Not on file   Highest education level: Not on file  Occupational History   Not on file  Tobacco Use   Smoking status: Former    Packs/day: 0.00    Years: 25.00    Total pack years: 0.00    Types: Cigarettes    Quit date: 05/02/2018    Years since quitting: 3.8   Smokeless tobacco: Former    Types: Snuff, Chew   Tobacco comments:    chewed as well  Vaping Use   Vaping Use: Every day   Substances: Nicotine  Substance and Sexual Activity   Alcohol use: Not Currently   Drug use: Not Currently   Sexual activity: Yes    Birth control/protection: None  Other Topics Concern   Not on file  Social History Narrative   Not on file   Social Determinants of Health    Financial Resource Strain: Low Risk  (12/27/2021)   Overall Financial Resource Strain (CARDIA)    Difficulty of Paying Living Expenses: Not very hard  Food Insecurity: No Food Insecurity (12/27/2021)   Hunger Vital Sign    Worried About Running Out of Food in the Last Year: Never true    Ran Out of Food in the Last Year: Never true  Transportation Needs: No Transportation Needs (12/27/2021)   PRAPARE - Hydrologist (Medical): No    Lack of Transportation (Non-Medical): No  Physical Activity: Insufficiently Active (12/27/2021)   Exercise Vital Sign    Days of Exercise per Week: 2 days    Minutes of Exercise per Session: 40 min  Stress: No Stress Concern Present (12/27/2021)   [place]    Feeling of Stress : Only a little  Social Connections: Socially Integrated (12/27/2021)   Social Connection and Isolation Panel [NHANES]    Frequency of Communication with Friends and Family: More than three times a week    Frequency of Social Gatherings with Friends and Family: Once a week    Attends Religious Services: More than 4 times per year    Active Member of [employer] or Organizations: Yes    Attends Archivist Meetings: More than 4 times per year    Marital Status: Married  Human resources officer Violence: Not At Risk (12/27/2021)   Humiliation, Afraid, Rape, and Kick questionnaire    Fear of Current or Ex-Partner: No    Emotionally Abused: No    Physically Abused: No    Sexually Abused: No    Review of Systems  All other systems reviewed and are negative.       Objective    BP 119/76   Pulse 69   Temp 97.7 F (36.5 C) (Oral)   Resp 16   Wt (!) 312 lb (141.5 kg)   SpO2 96%   BMI 41.16 kg/m   Physical Exam Vitals and nursing note reviewed.  Constitutional:      General: He is not in acute distress.    Appearance: He is obese.  Cardiovascular:     Rate and Rhythm:  Normal rate and regular rhythm.  Pulmonary:     Effort: Pulmonary effort is normal.     Breath sounds: Normal breath sounds.  Abdominal:     Palpations: Abdomen is soft.     Tenderness: There is no abdominal tenderness.  Neurological:     General: No focal deficit present.     Mental Status: He is alert and  oriented to person, place, and time.         Assessment & Plan:   1. Type 2 diabetes mellitus with other specified complication, with long-term current use of insulin (HCC) Improved A1c and at goal. Continue  - POCT glycosylated hemoglobin (Hb A1C)  2. Hyperlipidemia, unspecified hyperlipidemia type Continue   3. Need for shingles vaccine  - Varicella-zoster vaccine IM    Return in about 6 months (around 08/15/2022) for follow up.   Becky Sax, MD

## 2022-04-08 ENCOUNTER — Encounter: Payer: Self-pay | Admitting: Family Medicine

## 2022-05-27 ENCOUNTER — Other Ambulatory Visit: Payer: Self-pay | Admitting: *Deleted

## 2022-05-27 MED ORDER — FREESTYLE LIBRE 2 SENSOR MISC: 1 refills | Status: AC

## 2022-06-01 ENCOUNTER — Encounter: Payer: Self-pay | Admitting: Family Medicine

## 2022-06-02 ENCOUNTER — Other Ambulatory Visit: Payer: Self-pay

## 2022-06-02 ENCOUNTER — Telehealth: Payer: Self-pay

## 2022-06-02 NOTE — Telephone Encounter (Signed)
Prior authorization for [employer] 2 sensor submitted to ins today via CoverMyMeds Key: [id number]

## 2022-07-01 ENCOUNTER — Inpatient Hospital Stay: Payer: Medicare (Managed Care) | Attending: Oncology | Admitting: Oncology

## 2022-07-01 ENCOUNTER — Encounter: Payer: Self-pay | Admitting: Oncology

## 2022-07-01 VITALS — BP 119/74 | HR 77 | Temp 97.2°F | Resp 18 | Ht 73.0 in | Wt 324.0 lb

## 2022-07-01 DIAGNOSIS — Z7982 Long term (current) use of aspirin: Secondary | ICD-10-CM | POA: Diagnosis not present

## 2022-07-01 DIAGNOSIS — F200 Paranoid schizophrenia: Secondary | ICD-10-CM | POA: Insufficient documentation

## 2022-07-01 DIAGNOSIS — Z85818 Personal history of malignant neoplasm of other sites of lip, oral cavity, and pharynx: Secondary | ICD-10-CM | POA: Insufficient documentation

## 2022-07-01 DIAGNOSIS — Z7984 Long term (current) use of oral hypoglycemic drugs: Secondary | ICD-10-CM | POA: Insufficient documentation

## 2022-07-01 DIAGNOSIS — Z794 Long term (current) use of insulin: Secondary | ICD-10-CM | POA: Insufficient documentation

## 2022-07-01 DIAGNOSIS — Z9221 Personal history of antineoplastic chemotherapy: Secondary | ICD-10-CM | POA: Insufficient documentation

## 2022-07-01 DIAGNOSIS — E119 Type 2 diabetes mellitus without complications: Secondary | ICD-10-CM | POA: Insufficient documentation

## 2022-07-01 DIAGNOSIS — Z923 Personal history of irradiation: Secondary | ICD-10-CM | POA: Diagnosis not present

## 2022-07-01 DIAGNOSIS — Z79624 Long term (current) use of inhibitors of nucleotide synthesis: Secondary | ICD-10-CM | POA: Insufficient documentation

## 2022-07-01 DIAGNOSIS — Z800 Family history of malignant neoplasm of digestive organs: Secondary | ICD-10-CM | POA: Diagnosis not present

## 2022-07-01 DIAGNOSIS — Z79899 Other long term (current) drug therapy: Secondary | ICD-10-CM | POA: Insufficient documentation

## 2022-07-01 DIAGNOSIS — Z87891 Personal history of nicotine dependence: Secondary | ICD-10-CM | POA: Diagnosis not present

## 2022-07-01 DIAGNOSIS — C4442 Squamous cell carcinoma of skin of scalp and neck: Secondary | ICD-10-CM | POA: Diagnosis not present

## 2022-07-01 NOTE — Progress Notes (Signed)
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  INTERVAL HISTORY: Patient returns to clinic today for routine 65-month evaluation.  He continues to feel well and remains asymptomatic. He denies any pain or dysphagia. He has a good appetite and denies weight loss.  He has no neurologic complaints.  He denies any recent fevers or illnesses.  He denies any chest pain, shortness of breath, cough, or hemoptysis.  He has no nausea, vomiting, constipation, or diarrhea.  He has no urinary complaints.  Patient offers no specific complaints today.  REVIEW OF SYSTEMS:   Review of Systems  Constitutional: Negative.  Negative for fever, malaise/fatigue and weight loss.  HENT:  Negative for sinus pain and sore throat.   Respiratory: Negative.  Negative for cough, hemoptysis and shortness of breath.   Cardiovascular: Negative.  Negative for chest pain and leg swelling.  Gastrointestinal: Negative.  Negative for abdominal pain.  Genitourinary: Negative.  Negative for dysuria.  Musculoskeletal: Negative.  Negative for back pain and neck pain.  Skin: Negative.  Negative for rash.  Neurological: Negative.  Negative for dizziness, speech change, focal weakness, weakness and headaches.  Psychiatric/Behavioral: Negative.  The patient is not nervous/anxious.     As per HPI. Otherwise, a complete review of systems is negative.  PAST MEDICAL HISTORY: Paranoid schizophrenia. Past Medical  History:  Diagnosis Date   Allergy    Diabetes mellitus without complication (HCC)    Head and neck cancer (HCC)     PAST SURGICAL HISTORY: Cholecystectomy.  FAMILY HISTORY: Family History  Problem Relation Age of Onset   Colon cancer Father    Cancer Father    Colon cancer Paternal Grandmother     ADVANCED DIRECTIVES (Y/N):  N  HEALTH MAINTENANCE: Social History   Tobacco Use   Smoking status: Former    Packs/day: 0.00    Years: 25.00    Additional pack years: 0.00    Total pack years: 0.00    Types: Cigarettes    Quit date: 05/02/2018    Years since quitting: 4.1   Smokeless tobacco: Former    Types: Snuff, Chew   Tobacco comments:    chewed as well  Vaping Use   Vaping Use: Every day   Substances: Nicotine  Substance Use Topics   Alcohol use: Not Currently   Drug use: Not Currently     Colonoscopy:  PAP:  Bone density:  Lipid panel:  Allergies  Allergen Reactions   Iodine Swelling   Shellfish Allergy Swelling   Latex Rash    Current Outpatient Medications  Medication Sig Dispense Refill   ARIPiprazole (ABILIFY) 20 MG tablet Take 20 mg by mouth daily.     aspirin EC 81 MG tablet Take 1 tablet (81 mg total) by mouth daily. Swallow whole.     buPROPion (WELLBUTRIN XL) 150 MG 24 hr tablet bupropion HCl XL 150 mg 24 hr tablet, extended release     Continuous Blood Gluc Sensor (FREESTYLE LIBRE 2 SENSOR) MISC Utilize q14 days for glucose monitoring 6 each 1   FARXIGA 10 MG  TABS tablet Take 1 tablet (10 mg total) by mouth daily. 90 tablet 1   fenofibrate 160 MG tablet SMARTSIG:1 Tablet(s) By Mouth Every Evening     fluticasone (FLONASE) 50 MCG/ACT nasal spray Place 2 sprays into both nostrils daily.     icosapent Ethyl (VASCEPA) 1 g capsule Take 2 g by mouth 2 (two) times daily.     Insulin Glargine (BASAGLAR KWIKPEN [place]) Inject 12 Units into the skin daily.     Insulin Glargine (BASAGLAR KWIKPEN) 100 UNIT/ML SMARTSIG:12 Unit(s) SUB-Q Every Night 15 mL 5    metFORMIN (GLUCOPHAGE) 500 MG tablet Take 1 tablet (500 mg total) by mouth 2 (two) times daily with a meal. 180 tablet 1   omeprazole (PRILOSEC) 20 MG capsule Take 1 capsule (20 mg total) by mouth daily. 90 capsule 1   rizatriptan (MAXALT) 5 MG tablet Take by mouth.     rosuvastatin (CRESTOR) 20 MG tablet Take 20 mg by mouth daily.     traZODone (DESYREL) 100 MG tablet 100 mg at bedtime.      valACYclovir (VALTREX) 1000 MG tablet Take 1,000 mg by mouth 2 (two) times daily.     Vilazodone HCl 20 MG TABS Take by mouth. Increased to 30 mg     diphenhydrAMINE (BENADRYL) 50 MG capsule Take one capsule 1 hour prior to scan. (Patient not taking: Reported on 07/01/2022) 1 capsule 0   predniSONE (DELTASONE) 50 MG tablet Take one tablet (50 mg) 13 hours, 7 hours, and 1 hour prior to scan. (Patient not taking: Reported on 07/01/2022) 3 tablet 0   SUMAtriptan (IMITREX) 50 MG tablet Take by mouth as needed. (Patient not taking: Reported on 07/01/2022)     No current facility-administered medications for this visit.    OBJECTIVE: Vitals:   07/01/22 1035  BP: 119/74  Pulse: 77  Resp: 18  Temp: (!) 97.2 F (36.2 C)  SpO2: 99%      Body mass index is 42.75 kg/m.    ECOG FS:0 - Asymptomatic  General: Well-developed, well-nourished, no acute distress. Eyes: Pink conjunctiva, anicteric sclera. HEENT: Normocephalic, moist mucous membranes.  No palpable lymphadenopathy. Lungs: No audible wheezing or coughing. Heart: Regular rate and rhythm. Abdomen: Soft, nontender, no obvious distention. Musculoskeletal: No edema, cyanosis, or clubbing. Neuro: Alert, answering all questions appropriately. Cranial nerves grossly intact. Skin: No rashes or petechiae noted. Psych: Normal affect.  LAB RESULTS:  Lab Results  Component Value Date   NA 140 12/13/2021   K 4.3 12/13/2021   CL 101 12/13/2021   CO2 23 12/13/2021   GLUCOSE 95 12/13/2021   BUN 17 12/13/2021   CREATININE 1.29 (H) 12/13/2021   CALCIUM 10.0  12/13/2021   AST 39 11/14/2021   ALT 59 (H) 11/14/2021   GFRNONAA >60 12/23/2018   GFRAA >60 12/23/2018    Lab Results  Component Value Date   WBC 4.9 12/23/2018   NEUTROABS 2.9 12/23/2018   HGB 13.0 12/23/2018   HCT 38.4 (L) 12/23/2018   MCV 97.0 12/23/2018   PLT 173 12/23/2018     STUDIES: No results found.  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  PET scan results from March 15, 2019 reviewed independently with no obvious evidence of recurrent or progressive disease.  By report, patient had positive biopsy for recurrent squamous cell  carcinoma in his left buccal mucosa, but we do not have these results.  Repeat procedure completed on February 15, 2021 did not reveal any residual squamous carcinoma including no tumor at initial biopsy/resection margins.  PET scan results from March 15, 2021 reviewed independently with no hypermetabolic lesions to suggest recurrence.  Patient reports a normal ENT evaluation in the past several months.  Return to clinic in 6 months for routine evaluation.  Paranoid schizophrenia: Continue current medications.  Monitor.  Patient's wife reports that she is his healthcare power of attorney.  I spent a total of 20 minutes reviewing chart data, face-to-face evaluation with the patient, counseling and coordination of care as detailed above.   Patient expressed understanding and was in agreement with this plan. He also understands that He can call clinic at any time with any questions, concerns, or complaints.    Cancer Staging  Squamous cell carcinoma of head and neck Staging form: Oral Cavity, AJCC 8th Edition - Clinical: Stage IVA (cT2, cN2, cM0) - Signed by Jeralyn Ruths, MD on 08/02/2018 Laterality: Left   Jeralyn Ruths, MD   07/01/2022 11:05  AM

## 2022-07-08 ENCOUNTER — Other Ambulatory Visit: Payer: Self-pay | Admitting: *Deleted

## 2022-07-08 MED ORDER — ROSUVASTATIN CALCIUM 20 MG PO TABS: 20.0000 mg | ORAL_TABLET | Freq: Every day | ORAL | 0 refills | Status: DC

## 2022-07-08 MED ORDER — ICOSAPENT ETHYL 1 G PO CAPS: 2.0000 g | ORAL_CAPSULE | Freq: Two times a day (BID) | ORAL | 3 refills | Status: AC

## 2022-07-23 ENCOUNTER — Telehealth: Payer: Self-pay | Admitting: Family Medicine

## 2022-07-23 NOTE — Telephone Encounter (Signed)
Called pt to schedule 6 month follow up; left voicemail to call back office to schedule.

## 2022-08-29 ENCOUNTER — Ambulatory Visit (INDEPENDENT_AMBULATORY_CARE_PROVIDER_SITE_OTHER): Payer: Medicare Other | Admitting: Family Medicine

## 2022-08-29 VITALS — BP 116/78 | HR 77 | Temp 98.1°F | Resp 16 | Wt 340.0 lb

## 2022-08-29 DIAGNOSIS — Z794 Long term (current) use of insulin: Secondary | ICD-10-CM | POA: Diagnosis not present

## 2022-08-29 DIAGNOSIS — E785 Hyperlipidemia, unspecified: Secondary | ICD-10-CM | POA: Diagnosis not present

## 2022-08-29 DIAGNOSIS — U070 Vaping-related disorder: Secondary | ICD-10-CM

## 2022-08-29 DIAGNOSIS — Z7984 Long term (current) use of oral hypoglycemic drugs: Secondary | ICD-10-CM | POA: Diagnosis not present

## 2022-08-29 DIAGNOSIS — E1169 Type 2 diabetes mellitus with other specified complication: Secondary | ICD-10-CM | POA: Diagnosis not present

## 2022-08-29 DIAGNOSIS — G43009 Migraine without aura, not intractable, without status migrainosus: Secondary | ICD-10-CM | POA: Diagnosis not present

## 2022-08-29 LAB — POCT GLYCOSYLATED HEMOGLOBIN (HGB A1C): Hemoglobin A1C: 6.7 % — AB (ref 4.0–5.6)

## 2022-08-29 MED ORDER — RIZATRIPTAN BENZOATE 5 MG PO TABS: 5.0000 mg | ORAL_TABLET | ORAL | 0 refills | Status: AC | PRN

## 2022-08-29 NOTE — Progress Notes (Unsigned)
Established Patient Office Visit  Subjective    Patient ID: Lucas Escobar, male    DOB: 08-23-1970  Age: 52 y.o. MRN: [id number]  CC:  Chief Complaint  Patient presents with   Follow-up   Diabetes    HPI Lucas Escobar presents for routine follow up of chronic med issues. Patient denies acute complaints.    Outpatient Encounter Medications as of 08/29/2022  Medication Sig   ARIPiprazole (ABILIFY) 20 MG tablet Take 20 mg by mouth daily.   aspirin EC 81 MG tablet Take 1 tablet (81 mg total) by mouth daily. Swallow whole.   buPROPion (WELLBUTRIN XL) 150 MG 24 hr tablet bupropion HCl XL 150 mg 24 hr tablet, extended release   Continuous Blood Gluc Sensor (FREESTYLE LIBRE 2 SENSOR) MISC Utilize q14 days for glucose monitoring   diphenhydrAMINE (BENADRYL) 50 MG capsule Take one capsule 1 hour prior to scan.   FARXIGA 10 MG TABS tablet Take 1 tablet (10 mg total) by mouth daily.   fenofibrate 160 MG tablet SMARTSIG:1 Tablet(s) By Mouth Every Evening   fluticasone (FLONASE) 50 MCG/ACT nasal spray Place 2 sprays into both nostrils daily.   icosapent Ethyl (VASCEPA) 1 g capsule Take 2 capsules (2 g total) by mouth 2 (two) times daily.   Insulin Glargine (BASAGLAR KWIKPEN [place]) Inject 12 Units into the skin daily.   Insulin Glargine (BASAGLAR KWIKPEN) 100 UNIT/ML SMARTSIG:12 Unit(s) SUB-Q Every Night   metFORMIN (GLUCOPHAGE) 500 MG tablet Take 1 tablet (500 mg total) by mouth 2 (two) times daily with a meal.   omeprazole (PRILOSEC) 20 MG capsule Take 1 capsule (20 mg total) by mouth daily.   predniSONE (DELTASONE) 50 MG tablet Take one tablet (50 mg) 13 hours, 7 hours, and 1 hour prior to scan.   rizatriptan (MAXALT) 5 MG tablet Take by mouth.   rosuvastatin (CRESTOR) 20 MG tablet Take 1 tablet (20 mg total) by mouth daily.   SUMAtriptan (IMITREX) 50 MG tablet Take by mouth as needed.   traZODone (DESYREL) 100 MG tablet 100 mg at bedtime.    valACYclovir (VALTREX) 1000 MG tablet  Take 1,000 mg by mouth 2 (two) times daily.   Vilazodone HCl 20 MG TABS Take by mouth. Increased to 30 mg   No facility-administered encounter medications on file as of 08/29/2022.    Past Medical History:  Diagnosis Date   Allergy    Diabetes mellitus without complication (HCC)    Head and neck cancer [facility])     Past Surgical History:  Procedure Laterality Date   CHOLECYSTECTOMY  2004   PORTA CATH INSERTION N/A 09/20/2018   Procedure: PORTA CATH INSERTION;  Surgeon: Annice Needy, MD;  Location: [facility] INVASIVE CV LAB;  Service: Cardiovascular;  Laterality: N/A;    Family History  Problem Relation Age of Onset   Colon cancer Father    Cancer Father    Colon cancer Paternal Grandmother     Social History   Socioeconomic History   Marital status: Married    Spouse name: Not on file   Number of children: Not on file   Years of education: Not on file   Highest education level: Some college, no degree  Occupational History   Not on file  Tobacco Use   Smoking status: Former    Packs/day: 0.00    Years: 25.00    Additional pack years: 0.00    Total pack years: 0.00    Types: Cigarettes    Quit date:  05/02/2018    Years since quitting: 4.3   Smokeless tobacco: Former    Types: Snuff, Chew   Tobacco comments:    chewed as well  Vaping Use   Vaping Use: Every day   Substances: Nicotine  Substance and Sexual Activity   Alcohol use: Not Currently   Drug use: Not Currently   Sexual activity: Yes    Birth control/protection: None  Other Topics Concern   Not on file  Social History Narrative   Not on file   Social Determinants of Health   Financial Resource Strain: High Risk (08/29/2022)   Overall Financial Resource Strain (CARDIA)    Difficulty of Paying Living Expenses: Hard  Food Insecurity: Food Insecurity Present (08/29/2022)   Hunger Vital Sign    Worried About Running Out of Food in the Last Year: Sometimes true    Ran Out of Food in the Last Year: Never true   Transportation Needs: No Transportation Needs (08/29/2022)   PRAPARE - Administrator, Civil Service (Medical): No    Lack of Transportation (Non-Medical): No  Physical Activity: Insufficiently Active (08/29/2022)   Exercise Vital Sign    Days of Exercise per Week: 2 days    Minutes of Exercise per Session: 30 min  Stress: No Stress Concern Present (08/29/2022)   [employer] of Occupational Health - Occupational Stress Questionnaire    Feeling of Stress : Only a little  Social Connections: Socially Integrated (08/29/2022)   Social Connection and Isolation Panel [NHANES]    Frequency of Communication with Friends and Family: Three times a week    Frequency of Social Gatherings with Friends and Family: Once a week    Attends Religious Services: More than 4 times per year    Active Member of [employer] or Organizations: Yes    Attends Engineer, structural: More than 4 times per year    Marital Status: Married  Catering manager Violence: Not At Risk (12/27/2021)   Humiliation, Afraid, Rape, and Kick questionnaire    Fear of Current or Ex-Partner: No    Emotionally Abused: No    Physically Abused: No    Sexually Abused: No    Review of Systems  All other systems reviewed and are negative.       Objective    BP 116/78   Pulse 77   Temp 98.1 F (36.7 C) (Oral)   Resp 16   Wt (!) 340 lb (154.2 kg)   SpO2 95%   BMI 44.86 kg/m   Physical Exam Vitals and nursing note reviewed.  Constitutional:      General: He is not in acute distress.    Appearance: He is obese.  Cardiovascular:     Rate and Rhythm: Normal rate and regular rhythm.  Pulmonary:     Effort: Pulmonary effort is normal.     Breath sounds: Normal breath sounds.  Abdominal:     Palpations: Abdomen is soft.     Tenderness: There is no abdominal tenderness.  Neurological:     General: No focal deficit present.     Mental Status: He is alert and oriented to person, place, and time.      {Labs (Optional):23779}    Assessment & Plan:   1. Type 2 diabetes mellitus with other specified complication, with long-term current use of insulin (HCC) A1c at goal.  - POCT glycosylated hemoglobin (Hb A1C)  2. Hyperlipidemia, unspecified hyperlipidemia type Continue   3. Migraine without aura and without status  migrainosus, not intractable Maxalt refilled   No follow-ups on file.   Tommie Raymond, MD

## 2022-08-29 NOTE — Progress Notes (Unsigned)
Patient is here for their 6 month follow-up Patient has no concerns today Care gaps have been discussed with patient  

## 2022-09-11 LAB — HM DIABETES EYE EXAM

## 2022-09-26 ENCOUNTER — Encounter: Payer: Self-pay | Admitting: Family Medicine

## 2022-09-29 ENCOUNTER — Other Ambulatory Visit: Payer: Self-pay | Admitting: Family Medicine

## 2022-09-29 MED ORDER — BASAGLAR KWIKPEN 100 UNIT/ML ~~LOC~~ SOPN: PEN_INJECTOR | SUBCUTANEOUS | 5 refills | Status: AC

## 2022-09-29 MED ORDER — FENOFIBRATE 160 MG PO TABS: 160.0000 mg | ORAL_TABLET | Freq: Every day | ORAL | 1 refills | Status: AC

## 2022-10-01 ENCOUNTER — Other Ambulatory Visit: Payer: Self-pay | Admitting: Family Medicine

## 2022-10-02 NOTE — Telephone Encounter (Signed)
Requested Prescriptions  Pending Prescriptions Disp Refills   rosuvastatin (CRESTOR) 20 MG tablet [Pharmacy Med Name: ROSUVASTATIN 20MG  TABLETS] 90 tablet 0    Sig: TAKE 1 TABLET(20 MG) BY MOUTH DAILY     Cardiovascular:  Antilipid - Statins 2 Failed - 10/01/2022 11:31 AM      Failed - Cr in normal range and within 360 days    Creatinine, Ser  Date Value Ref Range Status  12/13/2021 1.29 (H) 0.76 - 1.27 mg/dL Final         Failed - Lipid Panel in normal range within the last 12 months    Cholesterol, Total  Date Value Ref Range Status  11/14/2021 128 100 - 199 mg/dL Final   LDL Chol Calc (NIH)  Date Value Ref Range Status  11/14/2021 61 0 - 99 mg/dL Final   HDL  Date Value Ref Range Status  11/14/2021 43 >39 mg/dL Final   Triglycerides  Date Value Ref Range Status  11/14/2021 137 0 - 149 mg/dL Final         Passed - Patient is not pregnant      Passed - Valid encounter within last 12 months    Recent Outpatient Visits           1 month ago Type 2 diabetes mellitus with other specified complication, with long-term current use of insulin (HCC)   [facility], Lauris Poag, MD   7 months ago Type 2 diabetes mellitus with other specified complication, with long-term current use of insulin (HCC)   [facility], Lauris Poag, MD   10 months ago Type 2 diabetes mellitus with other specified complication, with long-term current use of insulin [facility])   [facility], MD

## 2022-10-21 ENCOUNTER — Other Ambulatory Visit: Payer: Self-pay | Admitting: *Deleted

## 2022-10-24 ENCOUNTER — Encounter: Payer: Self-pay | Admitting: Oncology

## 2022-12-31 ENCOUNTER — Inpatient Hospital Stay: Payer: Medicare Other | Attending: Oncology | Admitting: Oncology
